# Patient Record
Sex: Female | Born: 1940 | Race: Black or African American | Hispanic: No | Smoking: Never smoker
Health system: Southern US, Community
[De-identification: ages and names within clinical notes are randomized; demographics above are authoritative.]

## PROBLEM LIST (undated history)

## (undated) DIAGNOSIS — I10 Essential (primary) hypertension: Secondary | ICD-10-CM

## (undated) DIAGNOSIS — E119 Type 2 diabetes mellitus without complications: Secondary | ICD-10-CM

## (undated) DIAGNOSIS — K219 Gastro-esophageal reflux disease without esophagitis: Secondary | ICD-10-CM

## (undated) DIAGNOSIS — M545 Low back pain, unspecified: Secondary | ICD-10-CM

## (undated) DIAGNOSIS — G8929 Other chronic pain: Secondary | ICD-10-CM

## (undated) DIAGNOSIS — E7800 Pure hypercholesterolemia, unspecified: Secondary | ICD-10-CM

## (undated) DIAGNOSIS — D699 Hemorrhagic condition, unspecified: Secondary | ICD-10-CM

## (undated) DIAGNOSIS — I2510 Atherosclerotic heart disease of native coronary artery without angina pectoris: Secondary | ICD-10-CM

## (undated) DIAGNOSIS — R011 Cardiac murmur, unspecified: Secondary | ICD-10-CM

## (undated) DIAGNOSIS — R51 Headache: Secondary | ICD-10-CM

## (undated) DIAGNOSIS — N289 Disorder of kidney and ureter, unspecified: Secondary | ICD-10-CM

## (undated) DIAGNOSIS — Z8711 Personal history of peptic ulcer disease: Secondary | ICD-10-CM

## (undated) DIAGNOSIS — R519 Headache, unspecified: Secondary | ICD-10-CM

## (undated) DIAGNOSIS — Z8719 Personal history of other diseases of the digestive system: Secondary | ICD-10-CM

## (undated) HISTORY — PX: DILATION AND CURETTAGE OF UTERUS: SHX78

## (undated) HISTORY — PX: RIGHT OOPHORECTOMY: SHX2359

## (undated) HISTORY — PX: GLAUCOMA SURGERY: SHX656

## (undated) HISTORY — PX: CATARACT EXTRACTION W/ INTRAOCULAR LENS IMPLANT: SHX1309

## (undated) HISTORY — PX: LEFT OOPHORECTOMY: SHX1961

## (undated) HISTORY — PX: TUBAL LIGATION: SHX77

## (undated) HISTORY — PX: ABDOMINAL HYSTERECTOMY: SHX81

---

## 2012-05-18 ENCOUNTER — Encounter (HOSPITAL_BASED_OUTPATIENT_CLINIC_OR_DEPARTMENT_OTHER): Payer: Self-pay | Admitting: Family Medicine

## 2012-05-18 ENCOUNTER — Emergency Department (HOSPITAL_BASED_OUTPATIENT_CLINIC_OR_DEPARTMENT_OTHER)
Admission: EM | Admit: 2012-05-18 | Discharge: 2012-05-18 | Disposition: A | Discharge status: Home / Self Care | Payer: Self-pay | Attending: Emergency Medicine | Admitting: Emergency Medicine

## 2012-05-18 DIAGNOSIS — H109 Unspecified conjunctivitis: Secondary | ICD-10-CM | POA: Insufficient documentation

## 2012-05-18 DIAGNOSIS — I10 Essential (primary) hypertension: Secondary | ICD-10-CM | POA: Insufficient documentation

## 2012-05-18 DIAGNOSIS — Z79899 Other long term (current) drug therapy: Secondary | ICD-10-CM | POA: Insufficient documentation

## 2012-05-18 DIAGNOSIS — E119 Type 2 diabetes mellitus without complications: Secondary | ICD-10-CM | POA: Insufficient documentation

## 2012-05-18 DIAGNOSIS — H5789 Other specified disorders of eye and adnexa: Secondary | ICD-10-CM | POA: Insufficient documentation

## 2012-05-18 DIAGNOSIS — Z90710 Acquired absence of both cervix and uterus: Secondary | ICD-10-CM | POA: Insufficient documentation

## 2012-05-18 DIAGNOSIS — K219 Gastro-esophageal reflux disease without esophagitis: Secondary | ICD-10-CM | POA: Insufficient documentation

## 2012-05-18 HISTORY — DX: Essential (primary) hypertension: I10

## 2012-05-18 HISTORY — DX: Gastro-esophageal reflux disease without esophagitis: K21.9

## 2012-05-18 MED ORDER — NAPHAZOLINE HCL 0.1 % OP SOLN: 1.0000 [drp] | Freq: Four times a day (QID) | OPHTHALMIC | Status: AC | PRN

## 2012-05-18 NOTE — ED Provider Notes (Signed)
History     CSN: [id number]  Arrival date & time 05/18/12  1034   First MD Initiated Contact with Patient 05/18/12 1102      Chief Complaint  Patient presents with  . Eye Problem     HPI Pt c/o itching, burning and drainage to left eye x 3 days. Pt sts vision is normal.   Past Medical History  Diagnosis Date  . Hypertension   . Diabetes mellitus without complication   . GERD (gastroesophageal reflux disease)     Past Surgical History  Procedure Date  . Cataract extraction   . Abdominal hysterectomy   . Dilation and curettage of uterus     No family history on file.  History  Substance Use Topics  . Smoking status: Never Smoker   . Smokeless tobacco: Former Neurosurgeon    Types: Snuff  . Alcohol Use: No     alcoholic    OB History    Grav Para Term Preterm Abortions TAB SAB Ect Mult Living                  Review of Systems All other systems reviewed and are negative Allergies  Amoxicillin and Aspirin  Home Medications   Current Outpatient Rx  Name Route Sig Dispense Refill  . AMLODIPINE BESYLATE PO Oral Take by mouth.    . METFORMIN HCL 500 MG PO TABS Oral Take 500 mg by mouth daily with breakfast.    . PANTOPRAZOLE SODIUM 20 MG PO TBEC Oral Take 20 mg by mouth daily.    Marland Kitchen POTASSIUM CHLORIDE ER 10 MEQ PO TBCR Oral Take 10 mEq by mouth 2 (two) times daily.    Marland Kitchen NAPHAZOLINE HCL 0.1 % OP SOLN Left Eye Place 1 drop into the left eye 4 (four) times daily as needed. 15 mL 0    BP 156/102  Pulse 74  Temp 98.3 F (36.8 C) (Oral)  Resp 18  Ht 5\' 3"  (1.6 m)  Wt 165 lb (74.844 kg)  BMI 29.23 kg/m2  SpO2 100%  Physical Exam  Nursing note and vitals reviewed. Constitutional: She is oriented to person, place, and time. She appears well-developed and well-nourished. No distress.  HENT:  Head: Normocephalic and atraumatic.  Eyes: Conjunctivae normal and EOM are normal. Pupils are equal, round, and reactive to light. No foreign bodies found. Right eye exhibits  no discharge, no exudate and no hordeolum. Left eye exhibits no discharge, no exudate and no hordeolum. No foreign body present in the left eye. No scleral icterus.  Neck: Normal range of motion.  Cardiovascular: Normal rate and intact distal pulses.   Pulmonary/Chest: No respiratory distress.  Abdominal: Normal appearance. She exhibits no distension.  Musculoskeletal: Normal range of motion.  Neurological: She is alert and oriented to person, place, and time. No cranial nerve deficit.  Skin: Skin is warm and dry. No rash noted.  Psychiatric: She has a normal mood and affect. Her behavior is normal.    ED Course  Procedures (including critical care time)  Labs Reviewed - No data to display No results found.   1. Conjunctivitis       MDM         Nelia Shi, MD 05/18/12 [phone]

## 2012-05-18 NOTE — ED Notes (Signed)
Pt c/o itching, burning and drainage to left eye x 3 days. Pt sts vision is normal.

## 2014-04-01 ENCOUNTER — Emergency Department (HOSPITAL_BASED_OUTPATIENT_CLINIC_OR_DEPARTMENT_OTHER)
Admission: EM | Admit: 2014-04-01 | Discharge: 2014-04-01 | Disposition: A | Discharge status: Home / Self Care | Payer: Medicare Other | Attending: Emergency Medicine | Admitting: Emergency Medicine

## 2014-04-01 ENCOUNTER — Emergency Department (HOSPITAL_BASED_OUTPATIENT_CLINIC_OR_DEPARTMENT_OTHER)
Admit: 2014-04-01 | Discharge: 2014-04-01 | Disposition: A | Discharge status: Home / Self Care | Payer: Medicare Other | Attending: Emergency Medicine | Admitting: Emergency Medicine

## 2014-04-01 ENCOUNTER — Encounter (HOSPITAL_BASED_OUTPATIENT_CLINIC_OR_DEPARTMENT_OTHER): Payer: Self-pay | Admitting: Emergency Medicine

## 2014-04-01 DIAGNOSIS — R519 Headache, unspecified: Secondary | ICD-10-CM

## 2014-04-01 DIAGNOSIS — E7800 Pure hypercholesterolemia, unspecified: Secondary | ICD-10-CM | POA: Insufficient documentation

## 2014-04-01 DIAGNOSIS — R059 Cough, unspecified: Secondary | ICD-10-CM | POA: Diagnosis not present

## 2014-04-01 DIAGNOSIS — E119 Type 2 diabetes mellitus without complications: Secondary | ICD-10-CM | POA: Diagnosis not present

## 2014-04-01 DIAGNOSIS — H539 Unspecified visual disturbance: Secondary | ICD-10-CM | POA: Insufficient documentation

## 2014-04-01 DIAGNOSIS — R51 Headache: Secondary | ICD-10-CM | POA: Insufficient documentation

## 2014-04-01 DIAGNOSIS — R05 Cough: Secondary | ICD-10-CM | POA: Diagnosis not present

## 2014-04-01 DIAGNOSIS — Z880 Allergy status to penicillin: Secondary | ICD-10-CM | POA: Insufficient documentation

## 2014-04-01 DIAGNOSIS — Z90710 Acquired absence of both cervix and uterus: Secondary | ICD-10-CM | POA: Insufficient documentation

## 2014-04-01 DIAGNOSIS — R1012 Left upper quadrant pain: Secondary | ICD-10-CM | POA: Diagnosis not present

## 2014-04-01 DIAGNOSIS — Z79899 Other long term (current) drug therapy: Secondary | ICD-10-CM | POA: Diagnosis not present

## 2014-04-01 DIAGNOSIS — K219 Gastro-esophageal reflux disease without esophagitis: Secondary | ICD-10-CM | POA: Diagnosis not present

## 2014-04-01 DIAGNOSIS — I10 Essential (primary) hypertension: Secondary | ICD-10-CM | POA: Insufficient documentation

## 2014-04-01 HISTORY — DX: Pure hypercholesterolemia, unspecified: E78.00

## 2014-04-01 LAB — CBC WITH DIFFERENTIAL/PLATELET
Basophils Absolute: 0 10*3/uL (ref 0.0–0.1)
Basophils Relative: 0 % (ref 0–1)
EOS PCT: 1 % (ref 0–5)
Eosinophils Absolute: 0.1 10*3/uL (ref 0.0–0.7)
HCT: 37.5 % (ref 36.0–46.0)
HEMOGLOBIN: 12.7 g/dL (ref 12.0–15.0)
LYMPHS ABS: 2.3 10*3/uL (ref 0.7–4.0)
LYMPHS PCT: 35 % (ref 12–46)
MCH: 27.9 pg (ref 26.0–34.0)
MCHC: 33.9 g/dL (ref 30.0–36.0)
MCV: 82.2 fL (ref 78.0–100.0)
MONOS PCT: 6 % (ref 3–12)
Monocytes Absolute: 0.4 10*3/uL (ref 0.1–1.0)
NEUTROS PCT: 58 % (ref 43–77)
Neutro Abs: 3.9 10*3/uL (ref 1.7–7.7)
PLATELETS: 256 10*3/uL (ref 150–400)
RBC: 4.56 MIL/uL (ref 3.87–5.11)
RDW: 14.6 % (ref 11.5–15.5)
WBC: 6.7 10*3/uL (ref 4.0–10.5)

## 2014-04-01 LAB — URINALYSIS, ROUTINE W REFLEX MICROSCOPIC
BILIRUBIN URINE: NEGATIVE
GLUCOSE, UA: NEGATIVE mg/dL
HGB URINE DIPSTICK: NEGATIVE
KETONES UR: NEGATIVE mg/dL
Leukocytes, UA: NEGATIVE
Nitrite: NEGATIVE
PROTEIN: NEGATIVE mg/dL
Specific Gravity, Urine: 1.003 — ABNORMAL LOW (ref 1.005–1.030)
UROBILINOGEN UA: 0.2 mg/dL (ref 0.0–1.0)
pH: 6.5 (ref 5.0–8.0)

## 2014-04-01 LAB — COMPREHENSIVE METABOLIC PANEL
ALK PHOS: 103 U/L (ref 39–117)
ALT: 12 U/L (ref 0–35)
ANION GAP: 14 (ref 5–15)
AST: 15 U/L (ref 0–37)
Albumin: 4.1 g/dL (ref 3.5–5.2)
BUN: 19 mg/dL (ref 6–23)
CO2: 26 meq/L (ref 19–32)
Calcium: 10 mg/dL (ref 8.4–10.5)
Chloride: 102 mEq/L (ref 96–112)
Creatinine, Ser: 1.2 mg/dL — ABNORMAL HIGH (ref 0.50–1.10)
GFR, EST AFRICAN AMERICAN: 52 mL/min — AB (ref >90)
GFR, EST NON AFRICAN AMERICAN: 45 mL/min — AB (ref >90)
GLUCOSE: 99 mg/dL (ref 70–99)
POTASSIUM: 3.6 meq/L — AB (ref 3.7–5.3)
SODIUM: 142 meq/L (ref 137–147)
Total Bilirubin: 0.5 mg/dL (ref 0.3–1.2)
Total Protein: 8 g/dL (ref 6.0–8.3)

## 2014-04-01 LAB — LIPASE, BLOOD: Lipase: 20 U/L (ref 11–59)

## 2014-04-01 MED ORDER — ESOMEPRAZOLE MAGNESIUM 40 MG PO CPDR: 40.0000 mg | DELAYED_RELEASE_CAPSULE | Freq: Every day | ORAL | Status: DC

## 2014-04-01 MED ORDER — METRONIDAZOLE 500 MG PO TABS: 500.0000 mg | ORAL_TABLET | Freq: Two times a day (BID) | ORAL | Status: DC

## 2014-04-01 MED ORDER — SODIUM CHLORIDE 0.9 % IV BOLUS (SEPSIS)
1000.0000 mL | Freq: Once | INTRAVENOUS | Status: AC
  Administered 2014-04-01: 1000 mL via INTRAVENOUS

## 2014-04-01 MED ORDER — GI COCKTAIL ~~LOC~~
30.0000 mL | Freq: Once | ORAL | Status: AC
  Administered 2014-04-01: 30 mL via ORAL
  Filled 2014-04-01: qty 30

## 2014-04-01 MED ORDER — CLARITHROMYCIN 500 MG PO TABS: 500.0000 mg | ORAL_TABLET | Freq: Two times a day (BID) | ORAL | Status: DC

## 2014-04-01 NOTE — ED Notes (Signed)
Periumbilical pain and Headaches x3 says. Denies vomiting or diarrhea.

## 2014-04-01 NOTE — ED Provider Notes (Signed)
CSN: [id number]     Arrival date & time 04/01/14  1736 History  This chart was scribed for Debby Freiberg, MD by Peyton Bottoms, ED Scribe. This patient was seen in room MH11/MH11 and the patient's care was started at 5:57 PM.   Chief Complaint  Patient presents with  . Abdominal Pain   Patient is a 73 y.o. female presenting with abdominal pain and headaches. The history is provided by the patient. No language interpreter was used.  Abdominal Pain Pain location:  LUQ Pain quality: aching and dull   Pain radiates to:  Does not radiate Pain severity:  Moderate Onset quality:  Gradual Duration:  2 weeks Timing:  Sporadic Progression:  Unchanged Chronicity:  Recurrent Context: previous surgery   Relieved by:  Nothing Worsened by:  Nothing tried Ineffective treatments:  None tried Associated symptoms: cough   Associated symptoms: no chest pain, no dysuria, no fever, no nausea, no shortness of breath, no sore throat, no vaginal bleeding, no vaginal discharge and no vomiting   Headache Pain location:  Generalized Quality:  Dull Radiates to:  Does not radiate Onset quality:  Gradual Timing:  Intermittent Similar to prior headaches: no   Associated symptoms: abdominal pain and cough   Associated symptoms: no dizziness, no fever, no nausea, no sore throat and no vomiting     HPI Comments: Caroline Fernandez is a 73 y.o. female with a prior history of hypertension, GERD, high cholesterol, diabetes, and abdominal hysterectomy, who presents to the Emergency Department complaining of moderate, gradually worsening left abdominal pain that began 2 weeks ago. Patient states that it is a "nagging pain that stays for a while and then goes away." She states that she has a history of similar symptoms in the past and the doctor at that time told her that her "abdomen was bleeding". Patient also complains of a headache that began 3 days ago as well. She states that she feels intermittent pain in her  temporal area. Patient also states that "she has been leaning on one side when walking" since onset of abdominal pain and headache. Patient states she noted blurred vision, but denies associated dizziness. Patient also complains of soreness bilaterally in her legs.  Past Medical History  Diagnosis Date  . Hypertension   . Diabetes mellitus without complication   . GERD (gastroesophageal reflux disease)   . High cholesterol   . Cataract    Past Surgical History  Procedure Laterality Date  . Cataract extraction    . Abdominal hysterectomy    . Dilation and curettage of uterus     No family history on file. History  Substance Use Topics  . Smoking status: Never Smoker   . Smokeless tobacco: Former Systems developer    Types: Snuff  . Alcohol Use: No     Comment: alcoholic   OB History   Grav Para Term Preterm Abortions TAB SAB Ect Mult Living                 Review of Systems  Constitutional: Negative for fever.  HENT: Negative for sore throat.   Eyes: Positive for visual disturbance.  Respiratory: Positive for cough. Negative for shortness of breath.   Cardiovascular: Negative for chest pain.  Gastrointestinal: Positive for abdominal pain. Negative for nausea and vomiting.  Genitourinary: Negative for dysuria, vaginal bleeding and vaginal discharge.  Neurological: Positive for headaches. Negative for dizziness.  All other systems reviewed and are negative.  Allergies  Amoxicillin and Aspirin  Home Medications  Prior to Admission medications   Medication Sig Start Date End Date Taking? Authorizing Provider  atorvastatin (LIPITOR) 20 MG tablet Take 20 mg by mouth daily.   Yes Historical Provider, MD  losartan (COZAAR) 50 MG tablet Take 50 mg by mouth daily.   Yes Historical Provider, MD  metFORMIN (GLUCOPHAGE) 500 MG tablet Take 500 mg by mouth daily with breakfast.   Yes Historical Provider, MD  pantoprazole (PROTONIX) 20 MG tablet Take 40 mg by mouth daily.    Yes Historical  Provider, MD  potassium chloride (K-DUR) 10 MEQ tablet Take 10 mEq by mouth 2 (two) times daily.   Yes Historical Provider, MD  AMLODIPINE BESYLATE PO Take by mouth.    Historical Provider, MD  clarithromycin (BIAXIN) 500 MG tablet Take 1 tablet (500 mg total) by mouth 2 (two) times daily. 04/01/14   Debby Freiberg, MD  esomeprazole (NEXIUM) 40 MG capsule Take 1 capsule (40 mg total) by mouth daily. 04/01/14   Debby Freiberg, MD  metroNIDAZOLE (FLAGYL) 500 MG tablet Take 1 tablet (500 mg total) by mouth 2 (two) times daily. 04/01/14   Debby Freiberg, MD  naphazoline (NAPHCON) 0.1 % ophthalmic solution Place 1 drop into the left eye 4 (four) times daily as needed. 05/18/12   Dot Lanes, MD   Triage Vitals: BP 192/88  Pulse 81  Temp(Src) 98.9 F (37.2 C) (Oral)  Resp 16  Ht 5\' 4"  (1.626 m)  Wt 160 lb (72.576 kg)  BMI 27.45 kg/m2  SpO2 99%  Physical Exam  Nursing note and vitals reviewed. Constitutional: She appears well-developed and well-nourished.  HENT:  Head: Normocephalic and atraumatic.  Eyes: Conjunctivae are normal. Right eye exhibits no discharge. Left eye exhibits no discharge.  Cardiovascular: Normal rate, regular rhythm and normal heart sounds.   Pulmonary/Chest: Effort normal and breath sounds normal. No respiratory distress.  Abdominal: There is tenderness (LUQ).  Neurological: She is alert. Coordination and gait normal.  Reflex Scores:      Patellar reflexes are 2+ on the right side and 2+ on the left side.      Achilles reflexes are 2+ on the right side and 2+ on the left side. Skin: Skin is warm and dry. No rash noted. She is not diaphoretic. No erythema.  Psychiatric: She has a normal mood and affect.   ED Course  Procedures (including critical care time)  DIAGNOSTIC STUDIES: Oxygen Saturation is 99% on RA, normal by my interpretation.    COORDINATION OF CARE: 11:49 PM- Pt advised of plan for treatment and pt agrees.  Labs Review Labs Reviewed   COMPREHENSIVE METABOLIC PANEL - Abnormal; Notable for the following:    Potassium 3.6 (*)    Creatinine, Ser 1.20 (*)    GFR calc non Af Amer 45 (*)    GFR calc Af Amer 52 (*)    All other components within normal limits  URINALYSIS, ROUTINE W REFLEX MICROSCOPIC - Abnormal; Notable for the following:    Specific Gravity, Urine 1.003 (*)    All other components within normal limits  CBC WITH DIFFERENTIAL  LIPASE, BLOOD  H. PYLORI ANTIBODY, IGG    Imaging Review Dg Chest 2 View  04/01/2014   CLINICAL DATA:  Abdominal pain.  Left chest discomfort.  EXAM: CHEST  2 VIEW  COMPARISON:  Chest radiograph 10/25/2013  FINDINGS:  Normal heart, mediastinal, and hilar contours. Stable severe convex right scoliosis of the thoracic spine. Lungs are normally expanded and clear. Negative for pleural effusion or  pneumothorax.  IMPRESSION: No active cardiopulmonary disease.  Chronic severe scoliosis.   Electronically Signed   By: Curlene Dolphin M.D.   On: 04/01/2014 18:53     EKG Interpretation None      MDM   Final diagnoses:  Left upper quadrant pain  Acute nonintractable headache, unspecified headache type    73 y.o. female  with pertinent PMH of GERD, HTN, DM presents with recurrent abdominal pain and headache x2-3 days. Patient has a chronic history of the same and requires repeated prompting to establish the true chronicity of her problems, initially she stated that she only had symptoms for 2 days, followed by one week, followed by one month.  On further discussion her symptoms began during the acute illness and death of her husband.  She states that she's been admitted in the past for "bleeding in her stomach ". She was not seen by a surgeon for this problem.     Obtained labs, EKG for generalized symptoms, cxr for same.  HA gradual onset, no concering historical features.  No indication for emergent imaging with gradual onset ha not present during my examination.  Pt asymptomatic throughout my  care.  No LLQ tenderness to indicate diverticular etiology, and with ho GERD, suspect likely PUD pathology.  Pt stable to dc home with pcp fu.  DC home in stable condition with triple therapy.  1. Left upper quadrant pain   2. Acute nonintractable headache, unspecified headache type       I personally performed the services described in this documentation, which was scribed in my presence. The recorded information has been reviewed and is accurate.    Debby Freiberg, MD 04/01/14 [phone]

## 2014-04-01 NOTE — ED Notes (Signed)
Pt ambulatory to restroom no difficulty.  

## 2014-04-01 NOTE — Discharge Instructions (Signed)
Abdominal Pain °Many things can cause abdominal pain. Usually, abdominal pain is not caused by a disease and will improve without treatment. It can often be observed and treated at home. Your health care provider will do a physical exam and possibly order blood tests and X-rays to help determine the seriousness of your pain. However, in many cases, more time must pass before a clear cause of the pain can be found. Before that point, your health care provider may not know if you need more testing or further treatment. °HOME CARE INSTRUCTIONS  °Monitor your abdominal pain for any changes. The following actions may help to alleviate any discomfort you are experiencing: °· Only take over-the-counter or prescription medicines as directed by your health care provider. °· Do not take laxatives unless directed to do so by your health care provider. °· Try a clear liquid diet (broth, tea, or water) as directed by your health care provider. Slowly move to a bland diet as tolerated. °SEEK MEDICAL CARE IF: °· You have unexplained abdominal pain. °· You have abdominal pain associated with nausea or diarrhea. °· You have pain when you urinate or have a bowel movement. °· You experience abdominal pain that wakes you in the night. °· You have abdominal pain that is worsened or improved by eating food. °· You have abdominal pain that is worsened with eating fatty foods. °· You have a fever. °SEEK IMMEDIATE MEDICAL CARE IF:  °· Your pain does not go away within 2 hours. °· You keep throwing up (vomiting). °· Your pain is felt only in portions of the abdomen, such as the right side or the left lower portion of the abdomen. °· You pass bloody or black tarry stools. °MAKE SURE YOU: °· Understand these instructions.   °· Will watch your condition.   °· Will get help right away if you are not doing well or get worse.   °Document Released: 04/20/2005 Document Revised: 07/16/2013 Document Reviewed: 03/20/2013 °ExitCare® Patient Information  ©2015 [employer]. This information is not intended to replace advice given to you by your health care provider. Make sure you discuss any questions you have with your health care provider. ° ° °Emergency Department Resource Guide °1) Find a Doctor and Pay Out of Pocket °Although you won't have to find out who is covered by your insurance plan, it is a good idea to ask around and get recommendations. You will then need to call the office and see if the doctor you have chosen will accept you as a new patient and what types of options they offer for patients who are self-pay. Some doctors offer discounts or will set up payment plans for their patients who do not have insurance, but you will need to ask so you aren't surprised when you get to your appointment. ° °2) Contact Your Local Health Department °Not all health departments have doctors that can see patients for sick visits, but many do, so it is worth a call to see if yours does. If you don't know where your local health department is, you can check in your phone book. The CDC also has a tool to help you locate your state's health department, and many state websites also have listings of all of their local health departments. ° °3) Find a Walk-in Clinic °If your illness is not likely to be very severe or complicated, you may want to try a walk in clinic. These are popping up all over the country in pharmacies, drugstores, and shopping centers. They're   usually staffed by nurse practitioners or physician assistants that have been trained to treat common illnesses and complaints. They're usually fairly quick and inexpensive. However, if you have serious medical issues or chronic medical problems, these are probably not your best option. ° °No Primary Care Doctor: °- Call [facility] at  832-8000 - they can help you locate a primary care doctor that  accepts your insurance, provides certain services, etc. °- Physician Referral Service- 1-800-533-3463 ° °Chronic  Pain Problems: °Organization         Address  Phone   Notes  °[facility]  (336) 297-2271 Patients need to be referred by their primary care doctor.  ° °Medication Assistance: °Organization         Address  Phone   Notes  °[place] [facility] [address] °[place], [place] [address] (336) 641-8030 --Must be a resident of [place] °-- Must have NO insurance coverage whatsoever (no Medicaid/ Medicare, etc.) °-- The pt. MUST have a primary care doctor that directs their care regularly and follows them in the community °  °[facility]  (866) 331-1348   °[employer]  (888) 892-1162   ° °Agencies that provide inexpensive medical care: °Organization         Address  Phone   Notes  °[facility]  (336) 832-8035   °[facility]    (336) 832-7272   °[facility] [address] °[place], [place] [address] (336) 832-4777   °[facility] [address], °[place] (336) 271-4999   °[facility]    (336) 373-0678   °[facility]    (336) 272-1050   °[facility] ° [address], [place] Phone:  (336) 832-4444, Fax:  (336) 832-4440 Hours of Operation:  9 am - 6 pm, M-F.  Also accepts Medicaid/Medicare and self-pay.  °[facility] ° [address], [place] Phone: (336) 832-3150, Fax: (336) 832-3151. Hours of Operation:  8:30 am - 5:30 pm, M-F.  Also accepts Medicaid and self-pay.  °[facility] [place] [address], [place] Phone: (336) 878-6027   °[facility] [address], [place], [place] (336)723-1848, Ext. 123 Mondays & Thursdays: 7-9 AM.  First 15 patients are seen on a first come, first serve basis. °  ° °Medicaid-accepting [place] Providers: ° °Organization         Address  Phone   Notes  °[facility] [address], [place] (336) 641-2100 Also  accepts self-pay patients.  °[facility] [address], [place] ° (336) 856-9996   °[facility] [address], [place] (336) 288-8857   °[facility] [address] [place] [address], [place] (336) 299-7000   °Veita Bland [address], [place]  ° (336) 373-1557 Only accepts [place] Access Medicaid patients after they have their name applied to their card.  ° °Self-Pay (no insurance) in [place]: ° °Organization         Address  Phone   Notes  °Sickle Cell Patients, [facility] [address], [place] (336) 832-1970   °[facility] [address], [place] (336) 832-4400   °[facility] ° [address], [place] (336) 992-4800   °[facility]/Dr. Osei-Bonsu ° [address] [place] [address], [place] or [address], [place] (336) 841-8500 Phone number for both [place] and [place] locations is the same.  °[facility] [address], [place] (336) 299-0000   °[facility] [address] [place] [address], [place] or [address] (336) 852-7530 °(336) 878-2260   °[facility] [address], [place] (336) 350-1642, phone; (336) 294-5005, fax Sees patients 1st and 3rd Saturday of every month.  Must not qualify for public or private insurance (i.e. Medicaid, Medicare, [place] Health Choice, Veterans' Benefits) • Household income should be no more than 200% of the poverty level •The clinic cannot treat you if you are pregnant or think you are pregnant • Sexually transmitted diseases are not treated at the clinic.  ° ° °Dental Care: °Organization         Address  Phone  Notes  °[facility] [address], [place] (336) 641-6152 Accepts children up to age 21 who are enrolled in Medicaid or [place] Health Choice; pregnant  women with a Medicaid card; and children who have applied for Medicaid or [place] Health Choice, but were declined, whose parents can pay a reduced fee at time of service.  °[facility] [place]  [address], [place] (336) 641-7733 Accepts children up to age 21 who are enrolled in Medicaid or [place] Health Choice; pregnant women with a Medicaid card; and children who have applied for Medicaid or [place] Health Choice, but were declined, whose parents can pay a reduced fee at time of service.  °[facility] ° [address], [place] (336) 641-4533 Patients are seen by appointment only. Walk-ins are not accepted. [facility] will see patients 18 years of age and older. °Monday - Tuesday (8am-5pm) °Most Wednesdays (8:30-5pm) °$30 per visit, cash only  °[facility] ° [address], [place] (336) 641-4533 Patients are seen by appointment only. Walk-ins are not accepted. [facility] will see patients 18 years of age and older. °One Wednesday Evening (Monthly: Volunteer Based).  $30 per visit, cash only  °[facility]  (919) 537-3737 for adults; Children under age 4, call Graduate Pediatric Dentistry at (919) [phone]. Children aged 4-14, please call (919) [phone] to request a pediatric application. ° Dental services are provided in all areas of dental care including fillings, crowns and bridges, complete and partial dentures, implants, gum treatment, root canals, and extractions. Preventive care is also provided. Treatment is provided to both adults and children. °Patients are selected via a lottery and there is often a waiting list. °  °[facility] [address], ° ° (336) 763-8833 [email or web address].[email or web address] °  °[facility] [address], [place], [place] (336)723-1848, Ext. 123 Second and Fourth Thursday of each month, opens at 6:30 AM; Clinic ends at 9 AM.  Patients are  seen on a first-come first-served basis, and a limited number are seen during each clinic.  ° °[facility] ° [address], [place], [place] (336) 723-7904   Eligibility Requirements °You must have lived in [place], [place], or [place] counties for at least the last three months. °  You cannot be eligible for state or federal sponsored healthcare insurance, including Veterans Administration, Medicaid, or Medicare. °  You generally cannot be eligible for healthcare insurance through   your employer.  °  How to apply: °Eligibility screenings are held every Tuesday and Wednesday afternoon from 1:00 pm until 4:00 pm. You do not need an appointment for the interview!  °[facility] [address], [place], [place] 336-631-2330   °[facility]  336-342-8273   °[facility]  336-703-3100   °[facility]  336-570-6415   ° °Behavioral Health Resources in the Community: °Intensive Outpatient Programs °Organization         Address  Phone  Notes  °[facility] [address], [place], [place] 336-878-6098   °[facility] [address], [place], [place] 336-832-9800   °[facility] [address], [place], [place] ° 336-882-2125   °[facility] [address],  °[place], [place] 1-800-853-5163 or 336-641-4981   °Substance Abuse Resources °Organization         Address  Phone  Notes  °[facility]  336-882-2125   °[facility]  336-784-9470   °[facility]  336-285-9073   °[facility]  336-845-3988   °Residential & Outpatient Substance Abuse Program  1-800-659-3381   °Psychological Services °Organization         Address  Phone  Notes  °[facility]  336- 832-9600   °[facility]  336- 378-7881   °[facility] [address], [place] 1-800-853-5163 or 336-641-4981   ° °Mobile Crisis  Teams °Organization         Address  Phone  Notes  °[facility], Mobile Crisis Care Unit  1-877-626-1772   °Assertive °[facility] ° [address] [place], [place] 336-834-9664   °Sharon DeEsch [address] °[place] 336-554-5454   ° °Self-Help/Support Groups °Organization         Address  Phone             Notes  °[facility] - variety of support groups  336- 373-1402 Call for more information  °Narcotics Anonymous (NA), [facility] [address], °[place]  2 meetings at this location  ° °Residential Treatment Programs °Organization         Address  Phone  Notes  °[facility] [address],    °[place]  1-866-801-8205   °[facility] ° [address], [place], [place] 704-293-8524   °[facility] [address], [place] 336-845-3988 Admissions: 8am-3pm M-F  °[facility] [address]    °[place], [place] 336-841-1104   °[facility] [address], [place], [place] 336-379-7146   °[facility] [address]  °[place], [place] 336-285-9073   °[facility] - Intensive Outpatient [address], , [place] 336-852-3033   °[facility] [address]  °[place], [place] 1-877-615-2722 or 336-784-9470   °[facility] [address] [place], [place] 336-227-7417 Accepts Medicaid  °[facility] [address]  ° [place] 1-800-659-3381 Substance Abuse/Addiction Treatment  ° °[place] Behavioral Health Resources °Organization         Address  Phone  Notes  °[facility]  (888) 581-9988   °Julie Brannon, PhD [address] [place],    (336) 349-5553 or (336) 951-0000   °[facility]   [address] °[place],  (336) 349-4454   °  [facility] [address], [place], [place] (336) 342-8316  Insurance/Medicaid/sponsorship through [facility]  °[facility] [address]                                    [place], [place] (336) 342-8316 Therapy/tele-psych/case  °[facility] [address]  ° [place], [place] (336) 349-2233    °Dr. Arfeen  (336) 349-4544   °[facility] [place]  [employer] [place] [facility] 1) [address], [place] °2) [address], [place] °3)  [address], [place] (336) 349-3220 °(336) 342-7768 ° °(336) 342-8140   °[place] [facility] (336) 342-1394 or (336) 342-3537 (After Hours)    ° ° ° ° °

## 2014-04-02 ENCOUNTER — Other Ambulatory Visit (HOSPITAL_BASED_OUTPATIENT_CLINIC_OR_DEPARTMENT_OTHER): Payer: Medicare Other

## 2014-04-02 LAB — H. PYLORI ANTIBODY, IGG: H Pylori IgG: 0.86 {ISR}

## 2014-08-24 ENCOUNTER — Observation Stay (HOSPITAL_BASED_OUTPATIENT_CLINIC_OR_DEPARTMENT_OTHER)
Admission: EM | Admit: 2014-08-24 | Discharge: 2014-08-25 | Disposition: A | Discharge status: Home / Self Care | Payer: Medicare Other | Attending: Internal Medicine | Admitting: Internal Medicine

## 2014-08-24 ENCOUNTER — Emergency Department (HOSPITAL_BASED_OUTPATIENT_CLINIC_OR_DEPARTMENT_OTHER): Payer: Medicare Other

## 2014-08-24 ENCOUNTER — Encounter (HOSPITAL_BASED_OUTPATIENT_CLINIC_OR_DEPARTMENT_OTHER): Payer: Self-pay | Admitting: *Deleted

## 2014-08-24 DIAGNOSIS — E785 Hyperlipidemia, unspecified: Secondary | ICD-10-CM | POA: Diagnosis present

## 2014-08-24 DIAGNOSIS — E876 Hypokalemia: Secondary | ICD-10-CM | POA: Diagnosis not present

## 2014-08-24 DIAGNOSIS — K219 Gastro-esophageal reflux disease without esophagitis: Secondary | ICD-10-CM | POA: Diagnosis not present

## 2014-08-24 DIAGNOSIS — E119 Type 2 diabetes mellitus without complications: Secondary | ICD-10-CM | POA: Diagnosis not present

## 2014-08-24 DIAGNOSIS — E780 Pure hypercholesterolemia: Secondary | ICD-10-CM | POA: Insufficient documentation

## 2014-08-24 DIAGNOSIS — I10 Essential (primary) hypertension: Secondary | ICD-10-CM | POA: Diagnosis not present

## 2014-08-24 DIAGNOSIS — R1013 Epigastric pain: Secondary | ICD-10-CM | POA: Diagnosis not present

## 2014-08-24 DIAGNOSIS — R197 Diarrhea, unspecified: Secondary | ICD-10-CM | POA: Insufficient documentation

## 2014-08-24 DIAGNOSIS — E860 Dehydration: Secondary | ICD-10-CM

## 2014-08-24 DIAGNOSIS — R55 Syncope and collapse: Principal | ICD-10-CM | POA: Diagnosis present

## 2014-08-24 DIAGNOSIS — E871 Hypo-osmolality and hyponatremia: Secondary | ICD-10-CM | POA: Diagnosis not present

## 2014-08-24 DIAGNOSIS — R52 Pain, unspecified: Secondary | ICD-10-CM

## 2014-08-24 LAB — COMPREHENSIVE METABOLIC PANEL
ALK PHOS: 88 U/L (ref 39–117)
ALT: 16 U/L (ref 0–35)
AST: 24 U/L (ref 0–37)
Albumin: 4.6 g/dL (ref 3.5–5.2)
Anion gap: 8 (ref 5–15)
BILIRUBIN TOTAL: 1.2 mg/dL (ref 0.3–1.2)
BUN: 12 mg/dL (ref 6–23)
CALCIUM: 9.6 mg/dL (ref 8.4–10.5)
CHLORIDE: 89 mmol/L — AB (ref 96–112)
CO2: 30 mmol/L (ref 19–32)
CREATININE: 1.06 mg/dL (ref 0.50–1.10)
GFR calc Af Amer: 60 mL/min — ABNORMAL LOW (ref >90)
GFR, EST NON AFRICAN AMERICAN: 52 mL/min — AB (ref >90)
Glucose, Bld: 133 mg/dL — ABNORMAL HIGH (ref 70–99)
POTASSIUM: 3 mmol/L — AB (ref 3.5–5.1)
Sodium: 127 mmol/L — ABNORMAL LOW (ref 135–145)
Total Protein: 8.3 g/dL (ref 6.0–8.3)

## 2014-08-24 LAB — CBC
HCT: 34.7 % — ABNORMAL LOW (ref 36.0–46.0)
HEMATOCRIT: 37.1 % (ref 36.0–46.0)
HEMOGLOBIN: 12.7 g/dL (ref 12.0–15.0)
Hemoglobin: 11.9 g/dL — ABNORMAL LOW (ref 12.0–15.0)
MCH: 27.1 pg (ref 26.0–34.0)
MCH: 27.7 pg (ref 26.0–34.0)
MCHC: 34.2 g/dL (ref 30.0–36.0)
MCHC: 34.3 g/dL (ref 30.0–36.0)
MCV: 79.3 fL (ref 78.0–100.0)
MCV: 80.9 fL (ref 78.0–100.0)
PLATELETS: 292 10*3/uL (ref 150–400)
Platelets: 271 10*3/uL (ref 150–400)
RBC: 4.68 MIL/uL (ref 3.87–5.11)
RBC: 4.29 MIL/uL (ref 3.87–5.11)
RDW: 13.1 % (ref 11.5–15.5)
RDW: 13.6 % (ref 11.5–15.5)
WBC: 5.5 10*3/uL (ref 4.0–10.5)
WBC: 4.8 10*3/uL (ref 4.0–10.5)

## 2014-08-24 LAB — TROPONIN I
Troponin I: <0.03 ng/mL (ref <0.031)
Troponin I: <0.03 ng/mL

## 2014-08-24 LAB — CREATININE, SERUM
CREATININE: 1.08 mg/dL (ref 0.50–1.10)
GFR calc Af Amer: 58 mL/min — ABNORMAL LOW (ref >90)
GFR calc non Af Amer: 50 mL/min — ABNORMAL LOW (ref >90)

## 2014-08-24 LAB — GLUCOSE, CAPILLARY
GLUCOSE-CAPILLARY: 106 mg/dL — AB (ref 70–99)
Glucose-Capillary: 190 mg/dL — ABNORMAL HIGH (ref 70–99)

## 2014-08-24 LAB — TSH: TSH: 0.93 u[IU]/mL (ref 0.350–4.500)

## 2014-08-24 LAB — CBG MONITORING, ED: Glucose-Capillary: 146 mg/dL — ABNORMAL HIGH (ref 70–99)

## 2014-08-24 MED ORDER — INSULIN ASPART 100 UNIT/ML ~~LOC~~ SOLN: 0.0000 [IU] | Freq: Every day | SUBCUTANEOUS | Status: DC

## 2014-08-24 MED ORDER — ASPIRIN EC 81 MG PO TBEC
81.0000 mg | DELAYED_RELEASE_TABLET | Freq: Every day | ORAL | Status: DC
  Administered 2014-08-25: 81 mg via ORAL
  Filled 2014-08-24 (×2): qty 1

## 2014-08-24 MED ORDER — SODIUM CHLORIDE 0.9 % IJ SOLN: 3.0000 mL | Freq: Two times a day (BID) | INTRAMUSCULAR | Status: DC

## 2014-08-24 MED ORDER — ONDANSETRON HCL 4 MG PO TABS: 4.0000 mg | ORAL_TABLET | Freq: Four times a day (QID) | ORAL | Status: DC | PRN

## 2014-08-24 MED ORDER — ATORVASTATIN CALCIUM 20 MG PO TABS
20.0000 mg | ORAL_TABLET | Freq: Every day | ORAL | Status: DC
  Administered 2014-08-24 – 2014-08-25 (×2): 20 mg via ORAL
  Filled 2014-08-24 (×2): qty 1

## 2014-08-24 MED ORDER — ONDANSETRON HCL 4 MG/2ML IJ SOLN: 4.0000 mg | Freq: Four times a day (QID) | INTRAMUSCULAR | Status: DC | PRN

## 2014-08-24 MED ORDER — CLONIDINE HCL 0.1 MG PO TABS
0.1000 mg | ORAL_TABLET | Freq: Two times a day (BID) | ORAL | Status: DC
  Administered 2014-08-24 – 2014-08-25 (×2): 0.1 mg via ORAL
  Filled 2014-08-24 (×3): qty 1

## 2014-08-24 MED ORDER — PANTOPRAZOLE SODIUM 40 MG PO TBEC: 40.0000 mg | DELAYED_RELEASE_TABLET | Freq: Every day | ORAL | Status: DC

## 2014-08-24 MED ORDER — PANTOPRAZOLE SODIUM 40 MG PO TBEC
40.0000 mg | DELAYED_RELEASE_TABLET | Freq: Every day | ORAL | Status: DC
  Administered 2014-08-24 – 2014-08-25 (×2): 40 mg via ORAL
  Filled 2014-08-24 (×2): qty 1

## 2014-08-24 MED ORDER — ALUM & MAG HYDROXIDE-SIMETH 200-200-20 MG/5ML PO SUSP: 30.0000 mL | Freq: Four times a day (QID) | ORAL | Status: DC | PRN

## 2014-08-24 MED ORDER — POTASSIUM CHLORIDE IN NACL 40-0.9 MEQ/L-% IV SOLN
INTRAVENOUS | Status: DC
  Administered 2014-08-24 – 2014-08-25 (×3): 100 mL/h via INTRAVENOUS
  Filled 2014-08-24 (×5): qty 1000

## 2014-08-24 MED ORDER — POTASSIUM CHLORIDE CRYS ER 20 MEQ PO TBCR
40.0000 meq | EXTENDED_RELEASE_TABLET | Freq: Once | ORAL | Status: AC
  Administered 2014-08-24: 40 meq via ORAL
  Filled 2014-08-24: qty 2

## 2014-08-24 MED ORDER — LOSARTAN POTASSIUM 50 MG PO TABS
50.0000 mg | ORAL_TABLET | Freq: Every day | ORAL | Status: DC
  Administered 2014-08-24 – 2014-08-25 (×2): 50 mg via ORAL
  Filled 2014-08-24 (×2): qty 1

## 2014-08-24 MED ORDER — POLYETHYLENE GLYCOL 3350 17 G PO PACK
17.0000 g | PACK | Freq: Every day | ORAL | Status: DC | PRN
  Filled 2014-08-24: qty 1

## 2014-08-24 MED ORDER — ENSURE COMPLETE PO LIQD: 237.0000 mL | Freq: Two times a day (BID) | ORAL | Status: DC

## 2014-08-24 MED ORDER — SODIUM CHLORIDE 0.9 % IV BOLUS (SEPSIS)
1000.0000 mL | Freq: Once | INTRAVENOUS | Status: AC
  Administered 2014-08-24: 1000 mL via INTRAVENOUS

## 2014-08-24 MED ORDER — ENOXAPARIN SODIUM 40 MG/0.4ML ~~LOC~~ SOLN
40.0000 mg | SUBCUTANEOUS | Status: DC
  Administered 2014-08-24: 40 mg via SUBCUTANEOUS
  Filled 2014-08-24 (×2): qty 0.4

## 2014-08-24 MED ORDER — AMLODIPINE BESYLATE 5 MG PO TABS
5.0000 mg | ORAL_TABLET | Freq: Every day | ORAL | Status: DC
  Administered 2014-08-24 – 2014-08-25 (×2): 5 mg via ORAL
  Filled 2014-08-24 (×2): qty 1

## 2014-08-24 MED ORDER — INSULIN ASPART 100 UNIT/ML ~~LOC~~ SOLN: 0.0000 [IU] | Freq: Three times a day (TID) | SUBCUTANEOUS | Status: DC

## 2014-08-24 NOTE — ED Notes (Signed)
Pt ambulatory to restroom

## 2014-08-24 NOTE — ED Provider Notes (Signed)
CSN: [id number]     Arrival date & time 08/24/14  2778 History   First MD Initiated Contact with Patient 08/24/14 0805     Chief Complaint  Patient presents with  . Weakness     (Consider location/radiation/quality/duration/timing/severity/associated sxs/prior Treatment) HPI Comments: Was about to get in the shower and became diaphoretic, dizzy, weak. Had near-syncope. Improved after sitting down and eating. No CP, but had SOB. States feeling bad since yesterday and has had intermittent abdominal pain for the past 2-3 days. Located around the umbilicus, radiates downward.  No dysuria, frequency.  Patient is a 74 y.o. female presenting with weakness. The history is provided by the patient.  Weakness This is a new problem. The current episode started 1 to 2 hours ago. The problem occurs constantly. The problem has been gradually improving. Associated symptoms include abdominal pain (intermittent, past few days, mild, sharp, central) and shortness of breath (mild). Nothing aggravates the symptoms. The symptoms are relieved by rest.    Past Medical History  Diagnosis Date  . Hypertension   . Diabetes mellitus without complication   . GERD (gastroesophageal reflux disease)   . High cholesterol   . Cataract    Past Surgical History  Procedure Laterality Date  . Cataract extraction    . Abdominal hysterectomy    . Dilation and curettage of uterus     No family history on file. History  Substance Use Topics  . Smoking status: Never Smoker   . Smokeless tobacco: Former Systems developer    Types: Snuff  . Alcohol Use: No     Comment: alcoholic   OB History    No data available     Review of Systems  Constitutional: Negative for fever.  Respiratory: Positive for shortness of breath (mild). Negative for cough.   Gastrointestinal: Positive for abdominal pain (intermittent, past few days, mild, sharp, central).  Neurological: Positive for weakness.  All other systems reviewed and are  negative.     Allergies  Amoxicillin and Aspirin  Home Medications   Prior to Admission medications   Medication Sig Start Date End Date Taking? Authorizing Provider  cloNIDine (CATAPRES) 0.1 MG tablet Take 0.1 mg by mouth 2 (two) times daily.   Yes Historical Provider, MD  hydrochlorothiazide (HYDRODIURIL) 25 MG tablet Take 25 mg by mouth daily.   Yes Historical Provider, MD  AMLODIPINE BESYLATE PO Take by mouth.    Historical Provider, MD  atorvastatin (LIPITOR) 20 MG tablet Take 20 mg by mouth daily.    Historical Provider, MD  clarithromycin (BIAXIN) 500 MG tablet Take 1 tablet (500 mg total) by mouth 2 (two) times daily. 04/01/14   Debby Freiberg, MD  esomeprazole (NEXIUM) 40 MG capsule Take 1 capsule (40 mg total) by mouth daily. 04/01/14   Debby Freiberg, MD  losartan (COZAAR) 50 MG tablet Take 50 mg by mouth daily.    Historical Provider, MD  metFORMIN (GLUCOPHAGE) 500 MG tablet Take 500 mg by mouth daily with breakfast.    Historical Provider, MD  metroNIDAZOLE (FLAGYL) 500 MG tablet Take 1 tablet (500 mg total) by mouth 2 (two) times daily. 04/01/14   Debby Freiberg, MD  naphazoline (NAPHCON) 0.1 % ophthalmic solution Place 1 drop into the left eye 4 (four) times daily as needed. 05/18/12   Dot Lanes, MD  pantoprazole (PROTONIX) 20 MG tablet Take 40 mg by mouth daily.     Historical Provider, MD  potassium chloride (K-DUR) 10 MEQ tablet Take 10 mEq by mouth  2 (two) times daily.    Historical Provider, MD   BP 160/80 mmHg  Pulse 83  Temp(Src) 98.5 F (36.9 C) (Oral)  Resp 20  Ht 5\' 3"  (1.6 m)  Wt 160 lb (72.576 kg)  BMI 28.35 kg/m2  SpO2 99% Physical Exam  Constitutional: She is oriented to person, place, and time. She appears well-developed and well-nourished. No distress.  HENT:  Head: Normocephalic and atraumatic.  Mouth/Throat: Oropharynx is clear and moist.  Eyes: EOM are normal. Pupils are equal, round, and reactive to light.  Neck: Normal range of motion. Neck  supple.  Cardiovascular: Normal rate and regular rhythm.  Exam reveals no friction rub.   No murmur heard. Pulmonary/Chest: Effort normal and breath sounds normal. No respiratory distress. She has no wheezes. She has no rales.  Abdominal: Soft. She exhibits no distension. There is no tenderness. There is no rebound.  Musculoskeletal: Normal range of motion. She exhibits no edema.  Neurological: She is alert and oriented to person, place, and time.  Skin: She is not diaphoretic.  Nursing note and vitals reviewed.   ED Course  Procedures (including critical care time) Labs Review Labs Reviewed  CBG MONITORING, ED - Abnormal; Notable for the following:    Glucose-Capillary 146 (*)    All other components within normal limits  CBC  COMPREHENSIVE METABOLIC PANEL  TROPONIN I    Imaging Review Dg Chest 2 View  08/24/2014   CLINICAL DATA:  Weakness, chills.  EXAM: CHEST  2 VIEW  COMPARISON:  April 01, 2014.  FINDINGS: The heart size and mediastinal contours are within normal limits. Both lungs are clear. No pneumothorax or pleural effusion is noted. Severe dextroscoliosis of mid thoracic spine is noted.  IMPRESSION: No acute cardiopulmonary abnormality seen.   Electronically Signed   By: Sabino Dick M.D.   On: 08/24/2014 09:14     EKG Interpretation   Date/Time:  Sunday August 24 2014 08:06:14 EST Ventricular Rate:  62 PR Interval:  172 QRS Duration: 100 QT Interval:  432 QTC Calculation: 438 R Axis:   69 Text Interpretation:  Normal sinus rhythm Left ventricular hypertrophy  with repolarization abnormality Abnormal ECG Repolrization abnormality new  Confirmed by Mingo Amber  MD, Marquavion Venhuizen ([id number]) on 08/24/2014 8:26:04 AM      MDM   Final diagnoses:  Near syncope  Hyponatremia  Dehydration  Hypokalemia    22F here with episode of diaphoresis, weakness, near-syncope. Improved with rest. Not back to normal now, but feeling a little better. Vitals ok.  Exam benign. No abdominal  pain. States episodes like this before she had low potassium. Will check labs. EKG with some repolarization changes, no major ischemic changes. Labs show mild hyponatremia, hypokalemia - likely dehydrated. Feeling better after fluids. Admitted for observation.  Evelina Bucy, MD 08/24/14 1027

## 2014-08-24 NOTE — ED Notes (Signed)
Attempted to call report to floor. Left call back number.

## 2014-08-24 NOTE — ED Notes (Addendum)
Patient states that she was getting dressed and became diaphoretic and weak. She states she thinks she had a "sugar attack", she ate something and states that she felt better after that. Also c/o lower back discomfort and mild left arm pain

## 2014-08-24 NOTE — H&P (Signed)
[facility] History and Physical                                                                                    Caroline Fernandez, is a 74 y.o. female  MRN: [id number]   DOB - January 25, 1941  Admit Date - 08/24/2014  Outpatient Primary MD for the patient is Pcp Not In [facility]  With History of -  Past Medical History  Diagnosis Date  . Hypertension   . Diabetes mellitus without complication   . GERD (gastroesophageal reflux disease)   . High cholesterol   . Cataract       Past Surgical History  Procedure Laterality Date  . Cataract extraction    . Abdominal hysterectomy    . Dilation and curettage of uterus      in for   Chief Complaint  Patient presents with  . Weakness     HPI    Caroline Fernandez  is a 74 y.o. female, with a past medical history of diabetes, GERD, hypertension, hyperlipidemia. She presented to [facility] after having a near syncopal event getting ready for church this morning. The patient reports that she was attempting to get dressed felt headed and dizzy and slightly short of breath. She went to the kitchen and got a bottle of water and some bread. She suddenly felt very faint. She did not lose consciousness. She called her son and asked him for help. Caroline Fernandez also reports that last Thursday and Friday she had episodes of umbilical abdominal pain and diarrhea immediately after eating. This seems to have resolved. Finally she complains of tingling feeling in her feet and abnormal pain in her fingers. She is being admitted to telemetry for a near syncopal event.  Review of Systems   In addition to the HPI above,  No Fever-chills, No Headache, No changes with Vision or hearing, No problems swallowing food or Liquids, No Blood in stool or Urine, No dysuria, No new skin rashes or bruises, No recent weight gain or loss, No polyuria, polydypsia or polyphagia, A full 10 point Review of Systems was done, except as  stated above, all other Review of Systems were negative.  Social History History  Substance Use Topics  . Smoking status: Never Smoker   . Smokeless tobacco: Former Systems developer    Types: Snuff  . Alcohol Use: No     Comment: alcoholic   she lives with her son and daughter, and is independent with her ADLs  Family History No family history on file.  Her mother passed with a myocardial infarction at age 52. Her father died of heart disease in his 82s.  Prior to Admission medications   Medication Sig Start Date End Date Taking? Authorizing Provider  cloNIDine (CATAPRES) 0.1 MG tablet Take 0.1 mg by mouth 2 (two) times daily.   Yes Historical Provider, MD  hydrochlorothiazide (HYDRODIURIL) 25 MG tablet Take 25 mg by mouth daily.   Yes Historical Provider, MD  AMLODIPINE BESYLATE PO Take by mouth.    Historical Provider, MD  atorvastatin (LIPITOR) 20 MG tablet Take 20 mg by mouth daily.    Historical  Provider, MD  clarithromycin (BIAXIN) 500 MG tablet Take 1 tablet (500 mg total) by mouth 2 (two) times daily. 04/01/14   Debby Freiberg, MD  esomeprazole (NEXIUM) 40 MG capsule Take 1 capsule (40 mg total) by mouth daily. 04/01/14   Debby Freiberg, MD  losartan (COZAAR) 50 MG tablet Take 50 mg by mouth daily.    Historical Provider, MD  metFORMIN (GLUCOPHAGE) 500 MG tablet Take 500 mg by mouth daily with breakfast.    Historical Provider, MD  metroNIDAZOLE (FLAGYL) 500 MG tablet Take 1 tablet (500 mg total) by mouth 2 (two) times daily. 04/01/14   Debby Freiberg, MD  naphazoline (NAPHCON) 0.1 % ophthalmic solution Place 1 drop into the left eye 4 (four) times daily as needed. 05/18/12   Dot Lanes, MD  pantoprazole (PROTONIX) 20 MG tablet Take 40 mg by mouth daily.     Historical Provider, MD  potassium chloride (K-DUR) 10 MEQ tablet Take 10 mEq by mouth 2 (two) times daily.    Historical Provider, MD    Allergies  Allergen Reactions  . Amoxicillin   . Aspirin     Physical  Exam  Vitals  Blood pressure 157/87, pulse 60, temperature 98.1 F (36.7 C), temperature source Oral, resp. rate 17, height 5\' 3"  (1.6 m), weight 72.576 kg (160 lb), SpO2 100 %.   General: Well-developed, well-nourished, pleasant female lying in bed in NAD, appears stated age  Psych:  Normal affect and insight, Not Suicidal or Homicidal, Awake Alert, Oriented X 3.  Neuro:   No F.N deficits, ALL C.Nerves Intact, Strength 5/5 all 4 extremities, Sensation intact all 4 extremities.  ENT:  Ears and Eyes appear Normal, Conjunctivae clear, PER. Moist oral mucosa without erythema or exudates.  Neck:  Supple, No lymphadenopathy appreciated  Respiratory:  Symmetrical chest wall movement, Good air movement bilaterally, CTAB.  Cardiac:  RRR, No Murmurs, no LE edema noted, no JVD.    Abdomen:  Positive bowel sounds, Soft, tender in the lower quadrants to mild palpation, Non distended,  No masses appreciated  Skin:  No Cyanosis, Normal Skin Turgor, No Skin Rash or Bruise.  Extremities:  Able to move all 4. 5/5 strength in each, no effusions.  Data Review  CBC  Recent Labs Lab 08/24/14 0920  WBC 5.5  HGB 12.7  HCT 37.1  PLT 292  MCV 79.3  MCH 27.1  MCHC 34.2  RDW 13.1    Chemistries   Recent Labs Lab 08/24/14 0920  NA 127*  K 3.0*  CL 89*  CO2 30  GLUCOSE 133*  BUN 12  CREATININE 1.06  CALCIUM 9.6  AST 24  ALT 16  ALKPHOS 88  BILITOT 1.2     Cardiac Enzymes  Recent Labs Lab 08/24/14 0920  TROPONINI <0.03    Invalid input(s): POCBNP   Imaging results:   Dg Chest 2 View  08/24/2014   CLINICAL DATA:  Weakness, chills.  EXAM: CHEST  2 VIEW  COMPARISON:  April 01, 2014.  FINDINGS: The heart size and mediastinal contours are within normal limits. Both lungs are clear. No pneumothorax or pleural effusion is noted. Severe dextroscoliosis of mid thoracic spine is noted.  IMPRESSION: No acute cardiopulmonary abnormality seen.   Electronically Signed   By: Sabino Dick M.D.   On: 08/24/2014 09:14    My personal review of EKG: NSR, No ST changes noted.   Assessment & Plan  Active Problems:   Near syncope   Abdominal pain, epigastric  Hyponatremia   Hypokalemia   GERD (gastroesophageal reflux disease)   HTN (hypertension)   HLD (hyperlipidemia)   Diabetes  Near syncopal event Uncertain etiology. Does not have obvious infectious or cardiac cause. Could have been related to BP, cbg, medications, or electrolyte abnormalities. Will check a 2 D echo, carotid Dopplers, TSH, urinalysis, Hgb A1C, and one troponin.  Will check orthostatic vital signs, and request PT evaluation .  Abdominal pain and diarrhea after eating Possibly due to gallbladder dysfunction or peptic ulcer disease. LFTs appear normal, but will check an abdominal ultrasound.  Will place patient on Protonix.  Hyponatremia and hypo-chloremia Likely due to dehydration. We will hold HCTZ And give gentle IV fluids  Hypokalemia Possibly due to GI losses. We will replete in IV fluids and oral supplement.  Hypertension Moderately elevated. Hold HCTZ. Continue clonidine, amlodipine, and losartan.  Diabetes mellitus On metformin at home. We'll check a hemoglobin A1c and place on sliding scale sensitive.    DVT Prophylaxis: lovenox  AM Labs Ordered, also please review Full Orders  Family Communication:   None at bedside.  Code Status:  full  Condition:  Guarded.  Time spent in minutes : 60   York, Bobby Rumpf PA-C on 08/24/2014 at 2:36 PM  Between 7am to 7pm - Pager - [phone]  After 7pm go to [email or web address] - password [email or web address]  And look for the night coverage person covering me after hours  [facility]   Attending Patient was seen, examined,treatment plan was discussed with the  Advance Practice Provider.  I have directly reviewed the clinical findings, lab, imaging studies and management of this patient in detail. I have made the necessary changes to  the above noted documentation, and agree with the documentation, as recorded by the Advance Practice Provider.   Nena Alexander MD [facility].

## 2014-08-25 ENCOUNTER — Observation Stay (HOSPITAL_COMMUNITY): Payer: Medicare Other

## 2014-08-25 DIAGNOSIS — E876 Hypokalemia: Secondary | ICD-10-CM | POA: Diagnosis not present

## 2014-08-25 DIAGNOSIS — E871 Hypo-osmolality and hyponatremia: Secondary | ICD-10-CM | POA: Diagnosis not present

## 2014-08-25 DIAGNOSIS — R55 Syncope and collapse: Secondary | ICD-10-CM

## 2014-08-25 DIAGNOSIS — R1013 Epigastric pain: Secondary | ICD-10-CM | POA: Diagnosis not present

## 2014-08-25 LAB — HEPATIC FUNCTION PANEL
ALT: 12 U/L (ref 0–35)
AST: 17 U/L (ref 0–37)
Albumin: 3.5 g/dL (ref 3.5–5.2)
Alkaline Phosphatase: 69 U/L (ref 39–117)
Total Bilirubin: 0.8 mg/dL (ref 0.3–1.2)
Total Protein: 6.5 g/dL (ref 6.0–8.3)

## 2014-08-25 LAB — BASIC METABOLIC PANEL
Anion gap: 5 (ref 5–15)
BUN: 10 mg/dL (ref 6–23)
CO2: 28 mmol/L (ref 19–32)
Calcium: 9.5 mg/dL (ref 8.4–10.5)
Chloride: 104 mmol/L (ref 96–112)
Creatinine, Ser: 1.04 mg/dL (ref 0.50–1.10)
GFR calc Af Amer: 61 mL/min — ABNORMAL LOW (ref >90)
GFR calc non Af Amer: 53 mL/min — ABNORMAL LOW (ref >90)
GLUCOSE: 114 mg/dL — AB (ref 70–99)
Potassium: 4.2 mmol/L (ref 3.5–5.1)
SODIUM: 137 mmol/L (ref 135–145)

## 2014-08-25 LAB — HEMOGLOBIN A1C
HEMOGLOBIN A1C: 6.9 % — AB (ref 4.8–5.6)
Mean Plasma Glucose: 151 mg/dL

## 2014-08-25 LAB — GLUCOSE, CAPILLARY
GLUCOSE-CAPILLARY: 115 mg/dL — AB (ref 70–99)
GLUCOSE-CAPILLARY: 82 mg/dL (ref 70–99)
Glucose-Capillary: 98 mg/dL (ref 70–99)

## 2014-08-25 MED ORDER — HYDROCHLOROTHIAZIDE 25 MG PO TABS: 25.0000 mg | ORAL_TABLET | Freq: Every day | ORAL | Status: DC

## 2014-08-25 NOTE — Progress Notes (Signed)
Pt/family given discharge instructions, medication lists, follow up appointments, and when to call the doctor.  Pt/family verbalizes understanding. Lenny Bouchillon McClintock, RN   

## 2014-08-25 NOTE — Discharge Summary (Addendum)
[facility]  Physician Discharge Summary   Patient ID: Brehanna Deveny MRN: [id number] DOB/AGE: March 21, 1941 74 y.o.  Admit date: 08/24/2014 Discharge date: 08/25/2014  PCP: Lorelei Pont, MD  DISCHARGE DIAGNOSES:  Active Problems:   Near syncope   Abdominal pain, epigastric   Hyponatremia   Hypokalemia   GERD (gastroesophageal reflux disease)   HTN (hypertension)   HLD (hyperlipidemia)   Diabetes   RECOMMENDATIONS FOR OUTPATIENT FOLLOW UP: 1. Patient to follow up with PCP regarding abdominal discomfort.  2. Bmet at follow up   DISCHARGE CONDITION: fair  Diet recommendation: Mod Carb  Filed Weights   08/24/14 0755  Weight: 72.576 kg (160 lb)    INITIAL HISTORY: Lilyona Richner is a 74 y.o. female, with a past medical history of diabetes, GERD, hypertension, hyperlipidemia. She presented to [facility] after having a near syncopal event while getting ready for church. The patient reports that she was attempting to get dressed felt headed and dizzy and slightly short of breath. She went to the kitchen and got a bottle of water and some bread. She suddenly felt very faint. She did not lose consciousness. She called her son and asked him for help.   Consultations:  None  Procedures: Carotid Doppler Preliminary findings: Bilateral: 1-39% ICA stenosis. Vertebral artery flow is antegrade  2D ECHO Study Conclusions - Left ventricle: The cavity size was normal. There was mildconcentric hypertrophy. Systolic function was normal. Theestimated ejection fraction was in the range of 55% to 60%.Doppler parameters are consistent with abnormal left ventricularrelaxation (grade 1 diastolic dysfunction). - Aortic valve: There was trivial regurgitation. - Tricuspid valve: There was mild-moderate regurgitation directedcentrally. - Pulmonary arteries: Systolic pressure was mildly increased. PApeak pressure: 44 mm Hg (S).  HOSPITAL COURSE:   Near syncopal  event Patient reported several episodes of diarrhea last week. She was noted to be hyponatremic at admission. She is on HCTZ which could have made it worse as well. I think her episode was likely due to dehydration. After hydration her sodium level has improved. Orthostatics are normal. No obvious infectious or cardiac cause. Carotid dopplers and ECHo as above and not very significant. She was seen by PT. She is back to baseline and ok for discharge.  Abdominal pain and diarrhea after eating She has had vague abdominal discomfort for many months. Has had this evaluated by her PCP. Reports unremarkable colonoscopy few years ago. No discomfort currently. [place] is unremarkable. She can follow up with her PCP for further evaluation.   Hyponatremia and hypo-chloremia Likely due to diarrhea and diuretics. Resolved with IVF.   Hypokalemia Possibly due to GI losses and diuretics. Repleted.   Essential Hypertension Continue home medications. Resume HCTZ after 3 days.   Diabetes mellitus 2 Continue home meds. HBA1c was 6.9.   Other work up unremarkable. She is ok for discharge.   PERTINENT LABS:  The results of significant diagnostics from this hospitalization (including imaging, microbiology, ancillary and laboratory) are listed below for reference.     Labs: Basic Metabolic Panel:  Recent Labs Lab 08/24/14 0920 08/24/14 1646 08/25/14 0452  NA 127*  --  137  K 3.0*  --  4.2  CL 89*  --  104  CO2 30  --  28  GLUCOSE 133*  --  114*  BUN 12  --  10  CREATININE 1.06 1.08 1.04  CALCIUM 9.6  --  9.5   Liver Function Tests:  Recent Labs Lab 08/24/14 0920 08/25/14 0452  AST 24 17  ALT 16 12  ALKPHOS 88 69  BILITOT 1.2 0.8  PROT 8.3 6.5  ALBUMIN 4.6 3.5   CBC:  Recent Labs Lab 08/24/14 0920 08/24/14 1646  WBC 5.5 4.8  HGB 12.7 11.9*  HCT 37.1 34.7*  MCV 79.3 80.9  PLT 292 271   Cardiac Enzymes:  Recent Labs Lab 08/24/14 0920 08/24/14 1646  TROPONINI <0.03 <0.03     CBG:  Recent Labs Lab 08/24/14 0803 08/24/14 1236 08/24/14 1601 08/25/14 0619 08/25/14 1133  GLUCAP 146* 106* 190* 115* 98     IMAGING STUDIES Dg Chest 2 View  08/24/2014   CLINICAL DATA:  Weakness, chills.  EXAM: CHEST  2 VIEW  COMPARISON:  April 01, 2014.  FINDINGS: The heart size and mediastinal contours are within normal limits. Both lungs are clear. No pneumothorax or pleural effusion is noted. Severe dextroscoliosis of mid thoracic spine is noted.  IMPRESSION: No acute cardiopulmonary abnormality seen.   Electronically Signed   By: Sabino Dick M.D.   On: 08/24/2014 09:14   US Abdomen Complete  08/25/2014   CLINICAL DATA:  Pain aggravated by eating and drinking. Symptoms for few weeks.  EXAM: ULTRASOUND ABDOMEN COMPLETE  COMPARISON:  CT 10/15/2012  FINDINGS: Gallbladder: No gallstones or wall thickening visualized. No sonographic Murphy sign noted.  Common bile duct: Diameter: 0.3 cm  Liver: No focal lesion identified. Within normal limits in parenchymal echogenicity.  IVC: No abnormality visualized.  Pancreas: Visualized portion unremarkable.  Spleen: Size and appearance within normal limits.  Right Kidney: Length: 10.1 cm. Echogenicity within normal limits. No mass or hydronephrosis visualized.  Left Kidney: Length: 10.9 cm. Echogenicity within normal limits. No mass or hydronephrosis visualized. Limited evaluation of the left kidney due to bowel gas.  Abdominal aorta: No aneurysm visualized.  Other findings: None.  IMPRESSION: Negative abdominal ultrasound.   Electronically Signed   By: Markus Daft M.D.   On: 08/25/2014 16:10    DISCHARGE EXAMINATION: Filed Vitals:   08/24/14 1217 08/24/14 2010 08/25/14 0502 08/25/14 1300  BP: 157/87 101/60 135/62 136/71  Pulse: 60 72 65 58  Temp: 98.1 F (36.7 C) 98.8 F (37.1 C) 98.6 F (37 C) 97.9 F (36.6 C)  TempSrc: Oral Oral Oral Oral  Resp: 17 18 18 18   Height:      Weight:      SpO2: 100% 98% 98% 100%   General  appearance: alert, cooperative, appears stated age and no distress Resp: clear to auscultation bilaterally Cardio: regular rate and rhythm, S1, S2 normal, no murmur, click, rub or gallop GI: soft, non-tender; bowel sounds normal; no masses,  no organomegaly Extremities: extremities normal, atraumatic, no cyanosis or edema Neurologic: Alert and oriented X 3, normal strength and tone. Normal symmetric reflexes. Normal coordination and gait  DISPOSITION: Home  Discharge Instructions    Call MD for:  difficulty breathing, headache or visual disturbances    Complete by:  As directed      Call MD for:  extreme fatigue    Complete by:  As directed      Call MD for:  persistant dizziness or light-headedness    Complete by:  As directed      Call MD for:  persistant nausea and vomiting    Complete by:  As directed      Call MD for:  severe uncontrolled pain    Complete by:  As directed      Diet Carb Modified    Complete by:  As directed  Discharge instructions    Complete by:  As directed   Please follow up with your PCP regarding your abdominal discomfort. Please have your Sodium and potassium levels checked at followup.     Increase activity slowly    Complete by:  As directed            ALLERGIES:  Allergies  Allergen Reactions  . Amoxicillin   . Aspirin     "makes her nervous"     Current Discharge Medication List    CONTINUE these medications which have CHANGED   Details  hydrochlorothiazide (HYDRODIURIL) 25 MG tablet Take 1 tablet (25 mg total) by mouth daily. Resume after 3 days and have your PCP check your sodium and potassium levels next week.      CONTINUE these medications which have NOT CHANGED   Details  atorvastatin (LIPITOR) 20 MG tablet Take 20 mg by mouth daily.    cholecalciferol (VITAMIN D) 400 UNITS TABS tablet Take 800 Units by mouth daily.    cloNIDine (CATAPRES) 0.1 MG tablet Take 0.1 mg by mouth daily.     metFORMIN (GLUCOPHAGE) 500 MG tablet  Take 500 mg by mouth daily with breakfast.    pantoprazole (PROTONIX) 20 MG tablet Take 40 mg by mouth daily.     potassium chloride (K-DUR) 10 MEQ tablet Take 20 mEq by mouth daily.     esomeprazole (NEXIUM) 40 MG capsule Take 1 capsule (40 mg total) by mouth daily. Qty: 30 capsule, Refills: 0    naphazoline (NAPHCON) 0.1 % ophthalmic solution Place 1 drop into the left eye 4 (four) times daily as needed. Qty: 15 mL, Refills: 0      STOP taking these medications     clarithromycin (BIAXIN) 500 MG tablet      metroNIDAZOLE (FLAGYL) 500 MG tablet      AMLODIPINE BESYLATE PO      losartan (COZAAR) 50 MG tablet        Follow-up Information    Follow up with PARUCHURI,VAMSEE, MD. Schedule an appointment as soon as possible for a visit in 1 week.   Specialty:  Internal Medicine   Why:  for blood work (bmet) and post hospitalization follow up      [place]: 35 mins  [facility] Pager [phone]  08/25/2014, 4:35 PM

## 2014-08-25 NOTE — Progress Notes (Signed)
  Echocardiogram 2D Echocardiogram has been performed.  Darlina Sicilian M 08/25/2014, 11:23 AM

## 2014-08-25 NOTE — Progress Notes (Signed)
Nutrition Brief Note  Patient identified on the Malnutrition Screening Tool (MST) Report  Per pt she has lost some weight in the last 2 months due to losing her husband. Her daughter and grandson live with her but handle food separately.  24 hr recall revealed pt consuming 100% of her needs, before husband passed pt likely was overeating. Pt cooks breakfast, eats out at lunch, and cooks dinner. Pt did report that she is unable to afford fresh fruits. We discussed good alternatives and suggestions for limiting breakfast meat to allow her more money to spend on more healthy items.  Pt is hopeful for d/c today.   Wt Readings from Last 15 Encounters:  08/24/14 160 lb (72.576 kg)  04/01/14 160 lb (72.576 kg)  05/18/12 165 lb (74.844 kg)    Body mass index is 28.35 kg/(m^2). Patient meets criteria for overweigth based on current BMI.   Current diet order is CHO Modified, patient is consuming approximately 100% of meals at this time. Labs and medications reviewed.   No nutrition interventions warranted at this time. If nutrition issues arise, please consult RD.   [place], [place], [place] Pager [phone] After Hours Pager

## 2014-08-25 NOTE — Progress Notes (Signed)
*  PRELIMINARY RESULTS* Vascular Ultrasound Carotid Duplex (Doppler) has been completed.  Preliminary findings: Bilateral:  1-39% ICA stenosis.  Vertebral artery flow is antegrade.      Landry Mellow, RDMS, RVT  08/25/2014, 2:58 PM

## 2014-08-25 NOTE — Evaluation (Signed)
Physical Therapy Evaluation and Discharge Patient Details Name: Caroline Fernandez MRN: [id number] DOB: [birth year] Today's Date: 08/25/2014   History of Present Illness  Caroline Fernandez  is a 74 y.o. female, with a past medical history of diabetes, GERD, hypertension, hyperlipidemia. She presented to [facility] after having a near syncopal event getting ready for church this morning. The patient reports that she was attempting to get dressed felt headed and dizzy and slightly short of breath.  Clinical Impression  Pt functioning at baseline and was unable to reproduce dizziness symptoms during mobility assessment. Pt with minimal falls risk as indicated by 23/24 score on DGI. Pt safe to d/c home with family once medically stable. Pt with no further acute rehab needs, PT SIGNING OFF. Please re-consult if needed in future.    Follow Up Recommendations No PT follow up    Equipment Recommendations  None recommended by PT    Recommendations for Other Services       Precautions / Restrictions Precautions Precautions: None Restrictions Weight Bearing Restrictions: No      Mobility  Bed Mobility Overal bed mobility: Modified Independent                Transfers Overall transfer level: Modified independent Equipment used: None             General transfer comment: no episodes of instability  Ambulation/Gait Ambulation/Gait assistance: Modified independent (Device/Increase time) Ambulation Distance (Feet): 250 Feet Assistive device: None Gait Pattern/deviations: Step-through pattern   Gait velocity interpretation: at or above normal speed for age/gender General Gait Details: no episodes of instability or dizziness  Stairs Stairs: Yes Stairs assistance: Modified independent (Device/Increase time) Stair Management: One rail Right Number of Stairs: 3    Wheelchair Mobility    Modified Rankin (Stroke Patients Only)       Balance Overall balance  assessment: Independent                               Standardized Balance Assessment Standardized Balance Assessment : Dynamic Gait Index   Dynamic Gait Index Level Surface: Normal Change in Gait Speed: Normal Gait with Horizontal Head Turns: Normal Gait with Vertical Head Turns: Normal Gait and Pivot Turn: Normal Step Over Obstacle: Normal Step Around Obstacles: Normal Steps: Mild Impairment Total Score: 23       Pertinent Vitals/Pain Pain Assessment: No/denies pain    Home Living Family/patient expects to be discharged to:: Private residence Living Arrangements: Children (dtr and grandson (39)) Available Help at Discharge: Family;Available 24 hours/day Type of Home: House Home Access: Level entry     Home Layout: One level Home Equipment: None      Prior Function Level of Independence: Independent               Hand Dominance   Dominant Hand: Right    Extremity/Trunk Assessment   Upper Extremity Assessment: Overall WFL for tasks assessed           Lower Extremity Assessment: Overall WFL for tasks assessed      Cervical / Trunk Assessment: Normal  Communication   Communication: No difficulties  Cognition Arousal/Alertness: Awake/alert Behavior During Therapy: WFL for tasks assessed/performed Overall Cognitive Status: Within Functional Limits for tasks assessed                      General Comments      Exercises  Assessment/Plan    PT Assessment Patient needs continued PT services  PT Diagnosis Generalized weakness   PT Problem List Decreased strength;Decreased range of motion;Decreased activity tolerance;Decreased balance;Decreased mobility  PT Treatment Interventions     PT Goals (Current goals can be found in the Care Plan section) Acute Rehab PT Goals Patient Stated Goal: home today PT Goal Formulation: All assessment and education complete, DC therapy    Frequency     Barriers to discharge         Co-evaluation               End of Session Equipment Utilized During Treatment: Gait belt Activity Tolerance: Patient tolerated treatment well Patient left: in chair;with call bell/phone within reach Nurse Communication: Mobility status    Functional Assessment Tool Used: clinical judgement Functional Limitation: Mobility: Walking and moving around Mobility: Walking and Moving Around Current Status [phone]): 0 percent impaired, limited or restricted Mobility: Walking and Moving Around Goal Status [phone]): 0 percent impaired, limited or restricted Mobility: Walking and Moving Around Discharge Status [phone]): 0 percent impaired, limited or restricted    Time: 2130-8657 PT Time Calculation (min) (ACUTE ONLY): 26 min   Charges:   PT Evaluation $Initial PT Evaluation Tier I: 1 Procedure PT Treatments $Gait Training: 8-22 mins   PT G Codes:   PT G-Codes **NOT FOR INPATIENT CLASS** Functional Assessment Tool Used: clinical judgement Functional Limitation: Mobility: Walking and moving around Mobility: Walking and Moving Around Current Status (Q4696): 0 percent impaired, limited or restricted Mobility: Walking and Moving Around Goal Status (E9528): 0 percent impaired, limited or restricted Mobility: Walking and Moving Around Discharge Status [phone]): 0 percent impaired, limited or restricted    Caroline Fernandez 08/25/2014, 9:40 AM   Caroline Fernandez, PT, DPT Pager #: [phone] Office #: [phone]

## 2014-08-25 NOTE — Discharge Instructions (Signed)
Near-Syncope °Near-syncope (commonly known as near fainting) is sudden weakness, dizziness, or feeling like you might pass out. During an episode of near-syncope, you may also develop pale skin, have tunnel vision, or feel sick to your stomach (nauseous). Near-syncope may occur when getting up after sitting or while standing for a long time. It is caused by a sudden decrease in blood flow to the brain. This decrease can result from various causes or triggers, most of which are not serious. However, because near-syncope can sometimes be a sign of something serious, a medical evaluation is required. The specific cause is often not determined. °HOME CARE INSTRUCTIONS  °Monitor your condition for any changes. The following actions may help to alleviate any discomfort you are experiencing: °· Have someone stay with you until you feel stable. °· Lie down right away and prop your feet up if you start feeling like you might faint. Breathe deeply and steadily. Wait until all the symptoms have passed. Most of these episodes last only a few minutes. You may feel tired for several hours.   °· Drink enough fluids to keep your urine clear or pale yellow.   °· If you are taking blood pressure or heart medicine, get up slowly when seated or lying down. Take several minutes to sit and then stand. This can reduce dizziness. °· Follow up with your health care provider as directed.  °SEEK IMMEDIATE MEDICAL CARE IF:  °· You have a severe headache.   °· You have unusual pain in the chest, abdomen, or back.   °· You are bleeding from the mouth or rectum, or you have black or tarry stool.   °· You have an irregular or very fast heartbeat.   °· You have repeated fainting or have seizure-like jerking during an episode.   °· You faint when sitting or lying down.   °· You have confusion.   °· You have difficulty walking.   °· You have severe weakness.   °· You have vision problems.   °MAKE SURE YOU:  °· Understand these instructions. °· Will  watch your condition. °· Will get help right away if you are not doing well or get worse. °Document Released: 07/11/2005 Document Revised: 07/16/2013 Document Reviewed: 12/14/2012 °ExitCare® Patient Information ©2015 [employer]. This information is not intended to replace advice given to you by your health care provider. Make sure you discuss any questions you have with your health care provider. °Hyponatremia  °Hyponatremia is when the amount of salt (sodium) in your blood is too low. When sodium levels are low, your cells will absorb extra water and swell. The swelling happens throughout the body, but it mostly affects the brain. Severe brain swelling (cerebral edema), seizures, or coma can happen.  °CAUSES  °· Heart, kidney, or liver problems. °· Thyroid problems. °· Adrenal gland problems. °· Severe vomiting and diarrhea. °· Certain medicines or illegal drugs. °· Dehydration. °· Drinking too much water. °· Low-sodium diet. °SYMPTOMS  °· Nausea and vomiting. °· Confusion. °· Lethargy. °· Agitation. °· Headache. °· Twitching or shaking (seizures). °· Unconsciousness. °· Appetite loss. °· Muscle weakness and cramping. °DIAGNOSIS  °Hyponatremia is identified by a simple blood test. Your caregiver will perform a history and physical exam to try to find the cause and type of hyponatremia. Other tests may be needed to measure the amount of sodium in your blood and urine. °TREATMENT  °Treatment will depend on the cause.  °· Fluids may be given through the vein (IV). °· Medicines may be used to correct the sodium imbalance. If medicines are causing   the problem, they will need to be adjusted. °· Water or fluid intake may be restricted to restore proper balance. °The speed of correcting the sodium problem is very important. If the problem is corrected too fast, nerve damage (sometimes unchangeable) can happen. °HOME CARE INSTRUCTIONS  °· Only take medicines as directed by your caregiver. Many medicines can make  hyponatremia worse. Discuss all your medicines with your caregiver. °· Carefully follow any recommended diet, including any fluid restrictions. °· You may be asked to repeat lab tests. Follow these directions. °· Avoid alcohol and recreational drugs. °SEEK MEDICAL CARE IF:  °· You develop worsening nausea, fatigue, headache, confusion, or weakness. °· Your original hyponatremia symptoms return. °· You have problems following the recommended diet. °SEEK IMMEDIATE MEDICAL CARE IF:  °· You have a seizure. °· You faint. °· You have ongoing diarrhea or vomiting. °MAKE SURE YOU:  °· Understand these instructions. °· Will watch your condition. °· Will get help right away if you are not doing well or get worse. °Document Released: 07/01/2002 Document Revised: 10/03/2011 Document Reviewed: 12/26/2010 °ExitCare® Patient Information ©2015 [employer]. This information is not intended to replace advice given to you by your health care provider. Make sure you discuss any questions you have with your health care provider. ° °

## 2014-08-25 NOTE — Progress Notes (Signed)
UR completed 

## 2014-10-08 ENCOUNTER — Encounter (HOSPITAL_BASED_OUTPATIENT_CLINIC_OR_DEPARTMENT_OTHER): Payer: Self-pay

## 2014-10-08 ENCOUNTER — Observation Stay (HOSPITAL_BASED_OUTPATIENT_CLINIC_OR_DEPARTMENT_OTHER)
Admission: EM | Admit: 2014-10-08 | Discharge: 2014-10-10 | Disposition: A | Discharge status: Home / Self Care | Payer: Medicare Other | Attending: Internal Medicine | Admitting: Internal Medicine

## 2014-10-08 DIAGNOSIS — E876 Hypokalemia: Secondary | ICD-10-CM | POA: Diagnosis not present

## 2014-10-08 DIAGNOSIS — Z6831 Body mass index (BMI) 31.0-31.9, adult: Secondary | ICD-10-CM | POA: Diagnosis not present

## 2014-10-08 DIAGNOSIS — I2510 Atherosclerotic heart disease of native coronary artery without angina pectoris: Secondary | ICD-10-CM | POA: Diagnosis not present

## 2014-10-08 DIAGNOSIS — H538 Other visual disturbances: Secondary | ICD-10-CM | POA: Diagnosis present

## 2014-10-08 DIAGNOSIS — Z961 Presence of intraocular lens: Secondary | ICD-10-CM | POA: Diagnosis not present

## 2014-10-08 DIAGNOSIS — R001 Bradycardia, unspecified: Secondary | ICD-10-CM | POA: Diagnosis not present

## 2014-10-08 DIAGNOSIS — R42 Dizziness and giddiness: Secondary | ICD-10-CM

## 2014-10-08 DIAGNOSIS — Z9842 Cataract extraction status, left eye: Secondary | ICD-10-CM | POA: Diagnosis not present

## 2014-10-08 DIAGNOSIS — I5032 Chronic diastolic (congestive) heart failure: Secondary | ICD-10-CM | POA: Diagnosis not present

## 2014-10-08 DIAGNOSIS — E119 Type 2 diabetes mellitus without complications: Secondary | ICD-10-CM | POA: Diagnosis not present

## 2014-10-08 DIAGNOSIS — Z87891 Personal history of nicotine dependence: Secondary | ICD-10-CM | POA: Diagnosis not present

## 2014-10-08 DIAGNOSIS — Z881 Allergy status to other antibiotic agents status: Secondary | ICD-10-CM | POA: Insufficient documentation

## 2014-10-08 DIAGNOSIS — E780 Pure hypercholesterolemia: Secondary | ICD-10-CM | POA: Insufficient documentation

## 2014-10-08 DIAGNOSIS — R1032 Left lower quadrant pain: Secondary | ICD-10-CM | POA: Insufficient documentation

## 2014-10-08 DIAGNOSIS — Z886 Allergy status to analgesic agent status: Secondary | ICD-10-CM | POA: Insufficient documentation

## 2014-10-08 DIAGNOSIS — Z9851 Tubal ligation status: Secondary | ICD-10-CM | POA: Diagnosis not present

## 2014-10-08 DIAGNOSIS — M545 Low back pain: Secondary | ICD-10-CM | POA: Diagnosis not present

## 2014-10-08 DIAGNOSIS — K219 Gastro-esophageal reflux disease without esophagitis: Secondary | ICD-10-CM | POA: Insufficient documentation

## 2014-10-08 DIAGNOSIS — I10 Essential (primary) hypertension: Secondary | ICD-10-CM | POA: Diagnosis present

## 2014-10-08 DIAGNOSIS — Z90710 Acquired absence of both cervix and uterus: Secondary | ICD-10-CM | POA: Diagnosis not present

## 2014-10-08 DIAGNOSIS — G8929 Other chronic pain: Secondary | ICD-10-CM | POA: Diagnosis not present

## 2014-10-08 DIAGNOSIS — G453 Amaurosis fugax: Secondary | ICD-10-CM | POA: Diagnosis not present

## 2014-10-08 HISTORY — DX: Atherosclerotic heart disease of native coronary artery without angina pectoris: I25.10

## 2014-10-08 HISTORY — DX: Disorder of kidney and ureter, unspecified: N28.9

## 2014-10-08 HISTORY — DX: Low back pain: M54.5

## 2014-10-08 HISTORY — DX: Personal history of peptic ulcer disease: Z87.11

## 2014-10-08 HISTORY — DX: Cardiac murmur, unspecified: R01.1

## 2014-10-08 HISTORY — DX: Headache: R51

## 2014-10-08 HISTORY — DX: Hemorrhagic condition, unspecified: D69.9

## 2014-10-08 HISTORY — DX: Personal history of other diseases of the digestive system: Z87.19

## 2014-10-08 HISTORY — DX: Other chronic pain: G89.29

## 2014-10-08 HISTORY — DX: Type 2 diabetes mellitus without complications: E11.9

## 2014-10-08 HISTORY — DX: Low back pain, unspecified: M54.50

## 2014-10-08 HISTORY — DX: Morbid (severe) obesity due to excess calories: E66.01

## 2014-10-08 HISTORY — DX: Headache, unspecified: R51.9

## 2014-10-08 NOTE — ED Notes (Addendum)
Pt reports dizziness that started Monday - worsens with movement - reports headaches, bilateral leg weakness. Pt reports seen on Monday at [facility] for same s/s and given meds to treat her BP and discharged home.

## 2014-10-09 ENCOUNTER — Encounter (HOSPITAL_BASED_OUTPATIENT_CLINIC_OR_DEPARTMENT_OTHER): Payer: Self-pay | Admitting: Emergency Medicine

## 2014-10-09 ENCOUNTER — Emergency Department (HOSPITAL_BASED_OUTPATIENT_CLINIC_OR_DEPARTMENT_OTHER): Payer: Medicare Other

## 2014-10-09 DIAGNOSIS — I5032 Chronic diastolic (congestive) heart failure: Secondary | ICD-10-CM

## 2014-10-09 DIAGNOSIS — E119 Type 2 diabetes mellitus without complications: Secondary | ICD-10-CM

## 2014-10-09 DIAGNOSIS — R001 Bradycardia, unspecified: Secondary | ICD-10-CM

## 2014-10-09 DIAGNOSIS — R42 Dizziness and giddiness: Secondary | ICD-10-CM

## 2014-10-09 DIAGNOSIS — E876 Hypokalemia: Secondary | ICD-10-CM | POA: Diagnosis present

## 2014-10-09 DIAGNOSIS — R1032 Left lower quadrant pain: Secondary | ICD-10-CM

## 2014-10-09 DIAGNOSIS — G453 Amaurosis fugax: Secondary | ICD-10-CM | POA: Diagnosis present

## 2014-10-09 DIAGNOSIS — I10 Essential (primary) hypertension: Secondary | ICD-10-CM | POA: Diagnosis present

## 2014-10-09 LAB — CBC
HCT: 32.9 % — ABNORMAL LOW (ref 36.0–46.0)
Hemoglobin: 11.3 g/dL — ABNORMAL LOW (ref 12.0–15.0)
MCH: 27.7 pg (ref 26.0–34.0)
MCHC: 34.3 g/dL (ref 30.0–36.0)
MCV: 80.6 fL (ref 78.0–100.0)
Platelets: 245 10*3/uL (ref 150–400)
RBC: 4.08 MIL/uL (ref 3.87–5.11)
RDW: 14 % (ref 11.5–15.5)
WBC: 6.4 10*3/uL (ref 4.0–10.5)

## 2014-10-09 LAB — BASIC METABOLIC PANEL
Anion gap: 11 (ref 5–15)
Anion gap: 8 (ref 5–15)
BUN: 15 mg/dL (ref 6–23)
BUN: 10 mg/dL (ref 6–23)
CHLORIDE: 91 mmol/L — AB (ref 96–112)
CO2: 28 mmol/L (ref 19–32)
CO2: 31 mmol/L (ref 19–32)
CREATININE: 1.09 mg/dL (ref 0.50–1.10)
Calcium: 9.3 mg/dL (ref 8.4–10.5)
Calcium: 9.4 mg/dL (ref 8.4–10.5)
Chloride: 93 mmol/L — ABNORMAL LOW (ref 96–112)
Creatinine, Ser: 1.12 mg/dL — ABNORMAL HIGH (ref 0.50–1.10)
GFR calc non Af Amer: 50 mL/min — ABNORMAL LOW (ref >90)
GFR calc non Af Amer: 48 mL/min — ABNORMAL LOW (ref >90)
GFR, EST AFRICAN AMERICAN: 58 mL/min — AB (ref >90)
GFR, EST AFRICAN AMERICAN: 56 mL/min — AB (ref >90)
GLUCOSE: 134 mg/dL — AB (ref 70–99)
Glucose, Bld: 118 mg/dL — ABNORMAL HIGH (ref 70–99)
Potassium: 2.8 mmol/L — ABNORMAL LOW (ref 3.5–5.1)
Potassium: 3.5 mmol/L (ref 3.5–5.1)
SODIUM: 132 mmol/L — AB (ref 135–145)
Sodium: 130 mmol/L — ABNORMAL LOW (ref 135–145)

## 2014-10-09 LAB — URINALYSIS, ROUTINE W REFLEX MICROSCOPIC
BILIRUBIN URINE: NEGATIVE
Glucose, UA: NEGATIVE mg/dL
Hgb urine dipstick: NEGATIVE
KETONES UR: NEGATIVE mg/dL
Leukocytes, UA: NEGATIVE
Nitrite: NEGATIVE
PROTEIN: NEGATIVE mg/dL
Specific Gravity, Urine: 1.005 (ref 1.005–1.030)
Urobilinogen, UA: 0.2 mg/dL (ref 0.0–1.0)
pH: 6.5 (ref 5.0–8.0)

## 2014-10-09 LAB — GLUCOSE, CAPILLARY
GLUCOSE-CAPILLARY: 86 mg/dL (ref 70–99)
GLUCOSE-CAPILLARY: 96 mg/dL (ref 70–99)
Glucose-Capillary: 121 mg/dL — ABNORMAL HIGH (ref 70–99)

## 2014-10-09 LAB — CBC WITH DIFFERENTIAL/PLATELET
BASOS ABS: 0 10*3/uL (ref 0.0–0.1)
BASOS PCT: 0 % (ref 0–1)
EOS ABS: 0.1 10*3/uL (ref 0.0–0.7)
Eosinophils Relative: 1 % (ref 0–5)
HCT: 33.4 % — ABNORMAL LOW (ref 36.0–46.0)
Hemoglobin: 11.7 g/dL — ABNORMAL LOW (ref 12.0–15.0)
LYMPHS PCT: 31 % (ref 12–46)
Lymphs Abs: 2.4 10*3/uL (ref 0.7–4.0)
MCH: 28.3 pg (ref 26.0–34.0)
MCHC: 35 g/dL (ref 30.0–36.0)
MCV: 80.9 fL (ref 78.0–100.0)
Monocytes Absolute: 0.5 10*3/uL (ref 0.1–1.0)
Monocytes Relative: 6 % (ref 3–12)
NEUTROS PCT: 62 % (ref 43–77)
Neutro Abs: 4.8 10*3/uL (ref 1.7–7.7)
Platelets: 225 10*3/uL (ref 150–400)
RBC: 4.13 MIL/uL (ref 3.87–5.11)
RDW: 13.5 % (ref 11.5–15.5)
WBC: 7.8 10*3/uL (ref 4.0–10.5)

## 2014-10-09 LAB — CBG MONITORING, ED: GLUCOSE-CAPILLARY: 97 mg/dL (ref 70–99)

## 2014-10-09 LAB — SEDIMENTATION RATE: SED RATE: 22 mm/h (ref 0–22)

## 2014-10-09 LAB — BRAIN NATRIURETIC PEPTIDE: B Natriuretic Peptide: 46.6 pg/mL (ref 0.0–100.0)

## 2014-10-09 MED ORDER — METFORMIN HCL 500 MG PO TABS
500.0000 mg | ORAL_TABLET | Freq: Every day | ORAL | Status: DC
  Administered 2014-10-09: 500 mg via ORAL
  Filled 2014-10-09 (×2): qty 1

## 2014-10-09 MED ORDER — PANTOPRAZOLE SODIUM 40 MG PO TBEC
40.0000 mg | DELAYED_RELEASE_TABLET | Freq: Every day | ORAL | Status: DC
  Administered 2014-10-09 – 2014-10-10 (×2): 40 mg via ORAL
  Filled 2014-10-09 (×2): qty 1

## 2014-10-09 MED ORDER — ACETAMINOPHEN 325 MG PO TABS: 650.0000 mg | ORAL_TABLET | Freq: Four times a day (QID) | ORAL | Status: DC | PRN

## 2014-10-09 MED ORDER — LISINOPRIL 5 MG PO TABS
5.0000 mg | ORAL_TABLET | Freq: Every day | ORAL | Status: DC
  Administered 2014-10-09 – 2014-10-10 (×2): 5 mg via ORAL
  Filled 2014-10-09 (×2): qty 1

## 2014-10-09 MED ORDER — INSULIN ASPART 100 UNIT/ML ~~LOC~~ SOLN: 0.0000 [IU] | Freq: Every day | SUBCUTANEOUS | Status: DC

## 2014-10-09 MED ORDER — ATORVASTATIN CALCIUM 20 MG PO TABS
20.0000 mg | ORAL_TABLET | Freq: Every day | ORAL | Status: DC
  Filled 2014-10-09: qty 1

## 2014-10-09 MED ORDER — ACETAMINOPHEN 650 MG RE SUPP: 650.0000 mg | Freq: Four times a day (QID) | RECTAL | Status: DC | PRN

## 2014-10-09 MED ORDER — INSULIN ASPART 100 UNIT/ML ~~LOC~~ SOLN: 0.0000 [IU] | Freq: Three times a day (TID) | SUBCUTANEOUS | Status: DC

## 2014-10-09 MED ORDER — SODIUM CHLORIDE 0.9 % IJ SOLN
3.0000 mL | Freq: Two times a day (BID) | INTRAMUSCULAR | Status: DC
  Administered 2014-10-09 – 2014-10-10 (×2): 3 mL via INTRAVENOUS

## 2014-10-09 MED ORDER — LIVING BETTER WITH HEART FAILURE BOOK
Freq: Once | Status: AC
  Administered 2014-10-09: 23:00:00

## 2014-10-09 MED ORDER — CLONIDINE HCL 0.1 MG PO TABS
0.1000 mg | ORAL_TABLET | Freq: Every day | ORAL | Status: DC
  Administered 2014-10-09: 0.1 mg via ORAL
  Filled 2014-10-09: qty 1

## 2014-10-09 MED ORDER — POTASSIUM CHLORIDE CRYS ER 20 MEQ PO TBCR
40.0000 meq | EXTENDED_RELEASE_TABLET | Freq: Once | ORAL | Status: AC
  Administered 2014-10-09: 40 meq via ORAL
  Filled 2014-10-09: qty 2

## 2014-10-09 MED ORDER — ONDANSETRON HCL 4 MG PO TABS: 4.0000 mg | ORAL_TABLET | Freq: Four times a day (QID) | ORAL | Status: DC | PRN

## 2014-10-09 MED ORDER — ONDANSETRON HCL 4 MG/2ML IJ SOLN: 4.0000 mg | Freq: Four times a day (QID) | INTRAMUSCULAR | Status: DC | PRN

## 2014-10-09 MED ORDER — ENOXAPARIN SODIUM 40 MG/0.4ML ~~LOC~~ SOLN
40.0000 mg | SUBCUTANEOUS | Status: DC
  Administered 2014-10-09: 40 mg via SUBCUTANEOUS
  Filled 2014-10-09 (×2): qty 0.4

## 2014-10-09 MED ORDER — POTASSIUM CHLORIDE ER 10 MEQ PO TBCR
20.0000 meq | EXTENDED_RELEASE_TABLET | Freq: Every day | ORAL | Status: DC
  Filled 2014-10-09: qty 2

## 2014-10-09 NOTE — ED Notes (Signed)
MD at bedside. 

## 2014-10-09 NOTE — ED Notes (Signed)
Pt took her own Vitamins, Lipitor and Potassium from her home medications. MD [facility] consulted and agreed

## 2014-10-09 NOTE — ED Provider Notes (Signed)
CSN: [id number]     Arrival date & time 10/08/14  2130 History   First MD Initiated Contact with Patient 10/09/14 0053     Chief Complaint  Patient presents with  . Dizziness     (Consider location/radiation/quality/duration/timing/severity/associated sxs/prior Treatment) HPI  This is a 74 year old female with a two-week history of dizziness. She is very vague in what she means by dizziness. She states she feels like her head is spinning but also she feels like she is going to pass out. It is worse with movement of her head. Over the same period of time she has had generalized malaise, nausea but no vomiting, decreased appetite, increased urination, headache and general weakness. She saw her PCP on March 7 who made no changes to her medications. She knew was seen at [facility] 3 days ago, was diagnosed with hypertension and was given "3 pills"but doesn't know what they were.  She has also been having episodes of darkening of her vision. These occur usually early in the morning when waking up but also occur during the day. The vision returns after several minutes. When her vision returns it is often blurred for a period of time before clearing.  Past Medical History  Diagnosis Date  . Hypertension   . Diabetes mellitus without complication   . GERD (gastroesophageal reflux disease)   . High cholesterol   . Cataract   . Coronary artery disease   . Renal disorder   . Morbid obesity    Past Surgical History  Procedure Laterality Date  . Cataract extraction    . Abdominal hysterectomy    . Dilation and curettage of uterus     History reviewed. No pertinent family history. History  Substance Use Topics  . Smoking status: Never Smoker   . Smokeless tobacco: Former Systems developer    Types: Snuff  . Alcohol Use: No     Comment: alcoholic   OB History    No data available     Review of Systems  All other systems reviewed and are negative.   Allergies  Amoxicillin and  Aspirin  Home Medications   Prior to Admission medications   Medication Sig Start Date End Date Taking? Authorizing Provider  atorvastatin (LIPITOR) 20 MG tablet Take 20 mg by mouth daily.   Yes Historical Provider, MD  cholecalciferol (VITAMIN D) 400 UNITS TABS tablet Take 800 Units by mouth daily.   Yes Historical Provider, MD  cloNIDine (CATAPRES) 0.1 MG tablet Take 0.1 mg by mouth daily.    Yes Historical Provider, MD  hydrochlorothiazide (HYDRODIURIL) 25 MG tablet Take 1 tablet (25 mg total) by mouth daily. Resume after 3 days and have your PCP check your sodium and potassium levels next week. 08/25/14  Yes Bonnielee Haff, MD  metFORMIN (GLUCOPHAGE) 500 MG tablet Take 500 mg by mouth daily with breakfast.   Yes Historical Provider, MD  naphazoline (NAPHCON) 0.1 % ophthalmic solution Place 1 drop into the left eye 4 (four) times daily as needed. 05/18/12  Yes Leonard Schwartz, MD  pantoprazole (PROTONIX) 20 MG tablet Take 40 mg by mouth daily.    Yes Historical Provider, MD  potassium chloride (K-DUR) 10 MEQ tablet Take 20 mEq by mouth daily.    Yes Historical Provider, MD  esomeprazole (NEXIUM) 40 MG capsule Take 1 capsule (40 mg total) by mouth daily. 04/01/14   Debby Freiberg, MD   BP 189/83 mmHg  Pulse 55  Temp(Src) 98.4 F (36.9 C) (Oral)  Resp  16  Ht 5\' 3"  (1.6 m)  Wt 176 lb (79.833 kg)  BMI 31.18 kg/m2  SpO2 99%   Physical Exam  General: Well-developed, well-nourished female in no acute distress; appearance consistent with age of record HENT: normocephalic; atraumatic Eyes: Pupils pinpoint; extraocular muscles intact; arcus senilis bilaterally Neck: supple Heart: regular rate and rhythm; bradycardia Lungs: clear to auscultation bilaterally Abdomen: soft; nondistended; nontender; no masses or hepatosplenomegaly; bowel sounds present Extremities: No deformity; full range of motion; pulses normal Neurologic: Awake, alert and oriented; motor function intact in all extremities and  symmetric; no facial droop Skin: Warm and dry Psychiatric: Flat affect    ED Course  Procedures (including critical care time)   EKG Interpretation   Date/Time:  Wednesday October 08 2014 21:41:29 EDT Ventricular Rate:  71 PR Interval:  172 QRS Duration: 88 QT Interval:  412 QTC Calculation: 447 R Axis:   53 Text Interpretation:  Normal sinus rhythm Possible Left atrial enlargement  T wave abnormality, consider inferior ischemia Abnormal ECG No significant  change since last tracing Confirmed by Mingo Amber  MD, Hancock ([id number]) on  10/08/2014 9:48:16 PM      MDM  Nursing notes and vitals signs, including pulse oximetry, reviewed.  Summary of this visit's results, reviewed by myself:  Labs:  Results for orders placed or performed during the hospital encounter of 10/08/14 (from the past 24 hour(s))  CBC with Differential/Platelet     Status: Abnormal   Collection Time: 10/09/14  1:15 AM  Result Value Ref Range   WBC 7.8 4.0 - 10.5 K/uL   RBC 4.13 3.87 - 5.11 MIL/uL   Hemoglobin 11.7 (L) 12.0 - 15.0 g/dL   HCT 33.4 (L) 36.0 - 46.0 %   MCV 80.9 78.0 - 100.0 fL   MCH 28.3 26.0 - 34.0 pg   MCHC 35.0 30.0 - 36.0 g/dL   RDW 13.5 11.5 - 15.5 %   Platelets 225 150 - 400 K/uL   Neutrophils Relative % 62 43 - 77 %   Neutro Abs 4.8 1.7 - 7.7 K/uL   Lymphocytes Relative 31 12 - 46 %   Lymphs Abs 2.4 0.7 - 4.0 K/uL   Monocytes Relative 6 3 - 12 %   Monocytes Absolute 0.5 0.1 - 1.0 K/uL   Eosinophils Relative 1 0 - 5 %   Eosinophils Absolute 0.1 0.0 - 0.7 K/uL   Basophils Relative 0 0 - 1 %   Basophils Absolute 0.0 0.0 - 0.1 K/uL  Basic metabolic panel     Status: Abnormal   Collection Time: 10/09/14  1:15 AM  Result Value Ref Range   Sodium 130 (L) 135 - 145 mmol/L   Potassium 2.8 (L) 3.5 - 5.1 mmol/L   Chloride 91 (L) 96 - 112 mmol/L   CO2 28 19 - 32 mmol/L   Glucose, Bld 118 (H) 70 - 99 mg/dL   BUN 15 6 - 23 mg/dL   Creatinine, Ser 1.09 0.50 - 1.10 mg/dL   Calcium 9.3 8.4 -  10.5 mg/dL   GFR calc non Af Amer 50 (L) >90 mL/min   GFR calc Af Amer 58 (L) >90 mL/min   Anion gap 11 5 - 15  Urinalysis, Routine w reflex microscopic     Status: None   Collection Time: 10/09/14  1:19 AM  Result Value Ref Range   Color, Urine YELLOW YELLOW   APPearance CLEAR CLEAR   Specific Gravity, Urine 1.005 1.005 - 1.030   pH 6.5 5.0 -  8.0   Glucose, UA NEGATIVE NEGATIVE mg/dL   Hgb urine dipstick NEGATIVE NEGATIVE   Bilirubin Urine NEGATIVE NEGATIVE   Ketones, ur NEGATIVE NEGATIVE mg/dL   Protein, ur NEGATIVE NEGATIVE mg/dL   Urobilinogen, UA 0.2 0.0 - 1.0 mg/dL   Nitrite NEGATIVE NEGATIVE   Leukocytes, UA NEGATIVE NEGATIVE    Imaging Studies: Ct Head Wo Contrast  10/09/2014   CLINICAL DATA:  Dizziness, 2 day duration.  Headaches.  EXAM: CT HEAD WITHOUT CONTRAST  TECHNIQUE: Contiguous axial images were obtained from the base of the skull through the vertex without intravenous contrast.  COMPARISON:  None.  FINDINGS: There is no intracranial hemorrhage, mass or evidence of acute infarction. There is mild generalized atrophy. There is mild chronic microvascular ischemic change. There is no significant extra-axial fluid collection.  No acute intracranial findings are evident.  IMPRESSION: No acute intracranial findings. Mild atrophy and microvascular changes.   Electronically Signed   By: Andreas Newport M.D.   On: 10/09/2014 02:06   2:35 AM Patient's blood pressure has improved and she states she feels better. Her visual changes or concerning for amaurosis fugax and we will have her admitted for further workup.      Shanon Rosser, MD 10/09/14 [phone]

## 2014-10-09 NOTE — ED Notes (Signed)
Call made to [facility], no beds available at this time. Waiting for an open bed to come available for patient to be transferred to [facility].

## 2014-10-09 NOTE — ED Notes (Signed)
Pt and family made aware of wait for admission bed. Pt comfortable at this time. Pt given PO fluids

## 2014-10-09 NOTE — ED Notes (Signed)
Pt provided with breakfast at this time. NAD noted. Continues to wait on inpatient bed.

## 2014-10-09 NOTE — ED Notes (Signed)
Pt denies any dizziness at this time

## 2014-10-09 NOTE — ED Notes (Signed)
Report called to Harriette Bouillon at [facility]

## 2014-10-09 NOTE — ED Notes (Signed)
Pt now has ready bed at [facility]. Bed number [id number].  [facility] en route to pick up patient at this time.

## 2014-10-09 NOTE — ED Notes (Signed)
Pt and family updated on wait. Pt is awaiting bed assignment from [facility].

## 2014-10-09 NOTE — H&P (Signed)
History and Physical  Caroline Fernandez NWG:[id number] DOB: 09/12/1940 DOA: 10/08/2014  Referring physician: Towanda Malkin, ER physician PCP: Lorelei Pont, MD   Chief Complaint: dizziness  HPI: Caroline Fernandez is a 74 y.o. female   with past history morbid obesity, diastolic heart failure and diabetes mellitus who had been evaluated a month ago and this health system and at that time complaining of dizziness and had full stroke workup which was unrevealing. At that time, her HCTZ had been discontinued. At some point this was restarted. Patient continues to complain of dizziness and weakness and came into the emergency room for further evaluation. Initial workup unrevealing, but she was noted to have hypokalemia. She is also complaining of symptoms of occasional blacking out of her vision. An ESR was checked which was normal. CT scan of the head was unrevealing. Hospitals were called for further evaluation and patient was transferred from [facility] here.  After arrival here, she was noted to have a resting heart rate of 49.   Review of Systems:  Patient seen after arrival to floor Pt complains of feeling very weak.  She does describe blurry vision as above, not currently going on.  She has intermittent random episodes of left lower quadrant abdominal pain, not always associated with food.  Pt denies any headache, dysphagia, chest pain, palpitations, shortness of breath, wheeze, cough, hematuria, dysuria, constipation, diarrhea, focal extremity numbness or weakness or pain  Review of systems are otherwise negative  Past Medical History  Diagnosis Date  . Hypertension   . GERD (gastroesophageal reflux disease)   . High cholesterol   . Coronary artery disease   . Morbid obesity   . Heart murmur   . Type II diabetes mellitus   . Bleeds easily   . History of stomach ulcers   . Daily headache     "lately" (10/09/2014)  . Chronic lower back pain   . Renal disorder    Past  Surgical History  Procedure Laterality Date  . Cataract extraction w/ intraocular lens implant Left   . Abdominal hysterectomy    . Right oophorectomy    . Left oophorectomy    . Tubal ligation    . Dilation and curettage of uterus  "several"  . Glaucoma surgery Left ~ 07/2014   Social History:  reports that she has never smoked. She has quit using smokeless tobacco. Her smokeless tobacco use included Snuff. She denies that she drinks alcohol. She reports that she does not use illicit drugs. Patient lives at home with her daughter and is normally able to participate in most activities of daily living without assistance  Allergies  Allergen Reactions  . Amoxicillin   . Aspirin     "makes her nervous"    Family history: Hypertension  Prior to Admission medications   Medication Sig Start Date End Date Taking? Authorizing Provider  atorvastatin (LIPITOR) 20 MG tablet Take 20 mg by mouth daily.   Yes Historical Provider, MD  cholecalciferol (VITAMIN D) 400 UNITS TABS tablet Take 800 Units by mouth daily.   Yes Historical Provider, MD  cloNIDine (CATAPRES) 0.1 MG tablet Take 0.1 mg by mouth daily.    Yes Historical Provider, MD  metFORMIN (GLUCOPHAGE) 500 MG tablet Take 500 mg by mouth daily with breakfast.   Yes Historical Provider, MD  naphazoline (NAPHCON) 0.1 % ophthalmic solution Place 1 drop into the left eye 4 (four) times daily as needed. 05/18/12  Yes Leonard Schwartz, MD  pantoprazole (PROTONIX) 20 MG  tablet Take 40 mg by mouth daily.    Yes Historical Provider, MD  potassium chloride (K-DUR) 10 MEQ tablet Take 20 mEq by mouth daily.    Yes Historical Provider, MD  esomeprazole (NEXIUM) 40 MG capsule Take 1 capsule (40 mg total) by mouth daily. 04/01/14   Debby Freiberg, MD    Physical Exam: BP 144/69 mmHg  Pulse 49  Temp(Src) 98.5 F (36.9 C) (Oral)  Resp 16  Ht '5\' 3"'  (1.6 m)  Wt 80.8 kg (178 lb 2.1 oz)  BMI 31.56 kg/m2  SpO2 100%  General:   Alert and oriented 3, no  acute distress Eyes: sclera nonicteric, extraocular movements are intact ENT: normocephalic may try to, mucous members are slightly dry Neck: no JVD Cardiovascular: regular rhythm, borderline bradycardia Respiratory: clear to auscultation bilaterally Abdomen: soft, obese, nontender, positive bowel sounds Skin: no skin breaks, tears or lesions Musculoskeletal: no clubbing or cyanosis, trace edema Psychiatric: patient is appropriate, no evidence of psychoses Neurologic: no focal neurological deficits           Labs on Admission:  Basic Metabolic Panel:  Recent Labs Lab 10/09/14 0115 10/09/14 1828  NA 130* 132*  K 2.8* 3.5  CL 91* 93*  CO2 28 31  GLUCOSE 118* 134*  BUN 15 10  CREATININE 1.09 1.12*  CALCIUM 9.3 9.4   Liver Function Tests: No results for input(s): AST, ALT, ALKPHOS, BILITOT, PROT, ALBUMIN in the last 168 hours. No results for input(s): LIPASE, AMYLASE in the last 168 hours. No results for input(s): AMMONIA in the last 168 hours. CBC:  Recent Labs Lab 10/09/14 0115  WBC 7.8  NEUTROABS 4.8  HGB 11.7*  HCT 33.4*  MCV 80.9  PLT 225   Cardiac Enzymes: No results for input(s): CKTOTAL, CKMB, CKMBINDEX, TROPONINI in the last 168 hours.  BNP (last 3 results) No results for input(s): BNP in the last 8760 hours.  ProBNP (last 3 results) No results for input(s): PROBNP in the last 8760 hours.  CBG:  Recent Labs Lab 10/09/14 0751 10/09/14 1559 10/09/14 1724  GLUCAP 97 121* 86    Radiological Exams on Admission: Ct Head Wo Contrast  10/09/2014   CLINICAL DATA:  Dizziness, 2 day duration.  Headaches.  EXAM: CT HEAD WITHOUT CONTRAST  TECHNIQUE: Contiguous axial images were obtained from the base of the skull through the vertex without intravenous contrast.  COMPARISON:  None.  FINDINGS: There is no intracranial hemorrhage, mass or evidence of acute infarction. There is mild generalized atrophy. There is mild chronic microvascular ischemic change. There  is no significant extra-axial fluid collection.  No acute intracranial findings are evident.  IMPRESSION: No acute intracranial findings. Mild atrophy and microvascular changes.   Electronically Signed   By: Andreas Newport M.D.   On: 10/09/2014 02:06    EKG: Independently reviewed. Normal sinus rhythm with inverted T waves in inferior leads, previously seen before. Signs of left atrial enlargement. Heart rate at EKG time was 71  Assessment/Plan Present on Admission:  . Amaurosis fugax, both eyes: No evidence of optic neuritis as ESR is normal. Negative tenderness. Patient is African-American. Suspect that she may be having issues with bradycardia and mild decreased hypoperfusion. See below.  . Hypertension: See below. Given that we are stopping her clonidine and she is a history of diastolic heart failure, ACE inhibitor would be quite appropriate for this patient. We'll start.  . Morbid obesity: Patient is criteria with BMI greater than 40  . Hypokalemia due to  loss of potassium: In discussion, patient is still taking her HCTZ, despite plan to stop that previously. Potassium given in emergency room. Rechecking and will supplement accordingly  . Bradycardia: Patient's resting heart rate in high 40s. Suspect that this is the main cause of her problems. Not on beta blocker, but is on clonidine which can cause this. We'll discontinue this and keep on monitoring  . Chronic diastolic heart failure: Echocardiogram done last year confirms grade 1 and asked [place] dysfunction. We'll check BNP  Left lower quadrant abdominal pain: Patient has history of straining. Suspect that she has diverticulosis and at times diverticulitis. No evidence of acute infection at this time. The patient information and recommend low residue high-fiber diet  Consultants: None  Code Status: Full code  Family Communication: Son at the bedside   Disposition Plan: Likely home tomorrow once heart rate improved and patient feeling  better  Time spent: 35 minutes  [facility] Pager [phone]

## 2014-10-10 DIAGNOSIS — R42 Dizziness and giddiness: Secondary | ICD-10-CM

## 2014-10-10 LAB — BASIC METABOLIC PANEL
ANION GAP: 7 (ref 5–15)
BUN: 13 mg/dL (ref 6–23)
CO2: 31 mmol/L (ref 19–32)
Calcium: 9.3 mg/dL (ref 8.4–10.5)
Chloride: 96 mmol/L (ref 96–112)
Creatinine, Ser: 1.09 mg/dL (ref 0.50–1.10)
GFR, EST AFRICAN AMERICAN: 58 mL/min — AB (ref >90)
GFR, EST NON AFRICAN AMERICAN: 50 mL/min — AB (ref >90)
Glucose, Bld: 98 mg/dL (ref 70–99)
POTASSIUM: 3.6 mmol/L (ref 3.5–5.1)
Sodium: 134 mmol/L — ABNORMAL LOW (ref 135–145)

## 2014-10-10 LAB — GLUCOSE, CAPILLARY
Glucose-Capillary: 98 mg/dL (ref 70–99)
Glucose-Capillary: 85 mg/dL (ref 70–99)

## 2014-10-10 MED ORDER — LISINOPRIL 5 MG PO TABS: 5.0000 mg | ORAL_TABLET | Freq: Every day | ORAL | Status: DC

## 2014-10-10 MED ORDER — LISINOPRIL 5 MG PO TABS
5.0000 mg | ORAL_TABLET | Freq: Every day | ORAL | Status: AC
Start: 2014-10-10 — End: ?

## 2014-10-10 NOTE — Discharge Summary (Signed)
Discharge Summary  Caroline Fernandez NHA:[id number] DOB: Jul 04, 1941  PCP: Lorelei Pont, MD  Admit date: 10/08/2014 Discharge date: 10/10/2014  Time spent: 25 minutes  Recommendations for Outpatient Follow-up:  1. Medication change: Patient's clonidine discontinued 2. New medication: Lisinopril 5 mg by mouth 3. Patient will follow up with her PCP in the next 4 weeks  Discharge Diagnoses:  Active Hospital Problems   Diagnosis Date Noted  . Amaurosis fugax, both eyes 10/09/2014  . Hypertension 10/09/2014  . Morbid obesity 10/09/2014  . Type II diabetes mellitus 10/09/2014  . Hypokalemia due to loss of potassium 10/09/2014  . Bradycardia 10/09/2014  . Chronic diastolic heart failure 83/29/1916  . Dizziness 10/09/2014    Resolved Hospital Problems   Diagnosis Date Noted Date Resolved  No resolved problems to display.    Discharge Condition: Improved, being discharged home  Diet recommendation: carb modified, low sodium  Filed Weights   10/08/14 2135 10/09/14 1557  Weight: 79.833 kg (176 lb) 80.8 kg (178 lb 2.1 oz)    History of present illness:  74 year old female with past mental history of hypertension diabetes who was admitted a little over a month ago for dizziness and at that time underwent full stroke workup. Since that time she's been started on clonidine for her high blood pressure. She will return back on 3/17 with more complaints of the same as well as complaints of blurry and cloudy vision. Initial lab work unremarkable other than hypokalemia and EKG normal, but resting heart rate noted to be around 49. BNP on admission was normal,   Hospital Course:  Active Problems:   Amaurosis fugax, both eyes: ESR unremarkable. Do not think that this is temporal arteritis.  Given full stroke workup one month ago, do not think that this is TIA. Suspect that this is likely bradycardia brought on by clonidine. By stopping clonidine, patient's heart rate improved to the mid 50s  by second day. Already feeling better.   Hypertension: See below   Morbid obesity: Patient is criteria with BMI greater than 40   Type II diabetes mellitus: CBG stable   Hypokalemia due to loss of potassium: Replaced, restarted on potassium supplement   Bradycardia: See above   Chronic diastolic heart failure: BNP normal, euvolemic. ACE inhibitor added   Dizziness: Secondary to bradycardia. Vaginal/clitoral discomfort: Patient claimed of some discomfort in her vaginal and clitoral area. This is related to her nurse. Patient requested a female physician to evaluate this as she would feel more comfortable. One of my colleagues examined the patient and found no evidence of irritation, lesion, tear or other unremarkable findings   Procedures: None  Consultations:  None  Discharge Exam: BP 122/58 mmHg  Pulse 55  Temp(Src) 98.4 F (36.9 C) (Oral)  Resp 18  Ht '5\' 3"'  (1.6 m)  Wt 80.8 kg (178 lb 2.1 oz)  BMI 31.56 kg/m2  SpO2 100%  General: Alert and oriented 3, no acute distress  Cardiovascular: Regular rhythm, mild bradycardia  Respiratory: Clear to auscultation bilaterally  Discharge Instructions You were cared for by a hospitalist during your hospital stay. If you have any questions about your discharge medications or the care you received while you were in the hospital after you are discharged, you can call the unit and asked to speak with the hospitalist on call if the hospitalist that took care of you is not available. Once you are discharged, your primary care physician will handle any further medical issues. Please note that NO REFILLS for any discharge medications  will be authorized once you are discharged, as it is imperative that you return to your primary care physician (or establish a relationship with a primary care physician if you do not have one) for your aftercare needs so that they can reassess your need for medications and monitor your lab values.  Discharge  Instructions    Diet - low sodium heart healthy    Complete by:  As directed      Increase activity slowly    Complete by:  As directed             Medication List    STOP taking these medications        cloNIDine 0.1 MG tablet  Commonly known as:  CATAPRES     esomeprazole 40 MG capsule  Commonly known as:  Masury these medications        atorvastatin 20 MG tablet  Commonly known as:  LIPITOR  Take 20 mg by mouth at bedtime.     cholecalciferol 400 UNITS Tabs tablet  Commonly known as:  VITAMIN D  Take 800 Units by mouth daily.     lisinopril 5 MG tablet  Commonly known as:  PRINIVIL,ZESTRIL  Take 1 tablet (5 mg total) by mouth daily.     metFORMIN 500 MG tablet  Commonly known as:  GLUCOPHAGE  Take 500 mg by mouth daily with breakfast.     naphazoline 0.1 % ophthalmic solution  Commonly known as:  NAPHCON  Place 1 drop into the left eye 4 (four) times daily as needed.     pantoprazole 40 MG tablet  Commonly known as:  PROTONIX  Take 40 mg by mouth daily.     potassium chloride 10 MEQ tablet  Commonly known as:  K-DUR  Take 20 mEq by mouth daily.       Allergies  Allergen Reactions  . Aspirin Other (See Comments)    "makes her nervous"  . Amoxicillin Itching and Rash       Follow-up Information    Follow up with PARUCHURI,VAMSEE, MD On 10/30/2014.   Specialty:  Internal Medicine   Why:  APPT scheduled for Thursday April 07th, 2016 @ 09:00am. Phone: [phone].       The results of significant diagnostics from this hospitalization (including imaging, microbiology, ancillary and laboratory) are listed below for reference.    Significant Diagnostic Studies: Ct Head Wo Contrast  10/09/2014   CLINICAL DATA:  Dizziness, 2 day duration.  Headaches.  EXAM: CT HEAD WITHOUT CONTRAST  TECHNIQUE: Contiguous axial images were obtained from the base of the skull through the vertex without intravenous contrast.  COMPARISON:  None.  FINDINGS: There  is no intracranial hemorrhage, mass or evidence of acute infarction. There is mild generalized atrophy. There is mild chronic microvascular ischemic change. There is no significant extra-axial fluid collection.  No acute intracranial findings are evident.  IMPRESSION: No acute intracranial findings. Mild atrophy and microvascular changes.   Electronically Signed   By: Andreas Newport M.D.   On: 10/09/2014 02:06    Microbiology: No results found for this or any previous visit (from the past 240 hour(s)).   Labs: Basic Metabolic Panel:  Recent Labs Lab 10/09/14 0115 10/09/14 1828 10/10/14 0600  NA 130* 132* 134*  K 2.8* 3.5 3.6  CL 91* 93* 96  CO2 '28 31 31  ' GLUCOSE 118* 134* 98  BUN '15 10 13  ' CREATININE 1.09 1.12* 1.09  CALCIUM 9.3 9.4  9.3   Liver Function Tests: No results for input(s): AST, ALT, ALKPHOS, BILITOT, PROT, ALBUMIN in the last 168 hours. No results for input(s): LIPASE, AMYLASE in the last 168 hours. No results for input(s): AMMONIA in the last 168 hours. CBC:  Recent Labs Lab 10/09/14 0115 10/09/14 2020  WBC 7.8 6.4  NEUTROABS 4.8  --   HGB 11.7* 11.3*  HCT 33.4* 32.9*  MCV 80.9 80.6  PLT 225 245   Cardiac Enzymes: No results for input(s): CKTOTAL, CKMB, CKMBINDEX, TROPONINI in the last 168 hours. BNP: BNP (last 3 results)  Recent Labs  10/09/14 1828  BNP 46.6    ProBNP (last 3 results) No results for input(s): PROBNP in the last 8760 hours.  CBG:  Recent Labs Lab 10/09/14 1559 10/09/14 1724 10/09/14 2318 10/10/14 0756 10/10/14 1204  GLUCAP 121* 86 96 98 85       Signed:  Ava Tangney K  [facility] 10/10/2014, 5:07 PM

## 2014-10-10 NOTE — Progress Notes (Signed)
Medicare Important Message given?  YES (If response is "NO", the following Medicare IM given date fields will be blank) Date Medicare IM given:  10/10/14 Medicare IM given by:  Anijah Spohr 

## 2014-10-10 NOTE — Progress Notes (Signed)
Patient discharge teaching given, including activity, diet, follow-up appoints, and medications. Patient verbalized understanding of all discharge instructions. IV access was d/c'd. Vitals are stable. Skin is intact except as charted in most recent assessments. Pt to be escorted out by NT, to be driven home by family. 

## 2014-10-10 NOTE — Discharge Instructions (Signed)

## 2014-10-10 NOTE — Care Management Note (Signed)
    Page 1 of 1   10/10/2014     4:42:25 PM CARE MANAGEMENT NOTE 10/10/2014  Patient:  IRIDIANA, FONNER   Account Number:  [id number]  Date Initiated:  10/10/2014  Documentation initiated by:  Tomi Bamberger  Subjective/Objective Assessment:   dx dizziness  admit- lives with daughter.     Action/Plan:   Anticipated DC Date:  10/10/2014   Anticipated DC Plan:  HOME/SELF CARE      DC Planning Services  CM consult      Choice offered to / List presented to:             Status of service:  Completed, signed off Medicare Important Message given?  NA - LOS <3 / Initial given by admissions (If response is "NO", the following Medicare IM given date fields will be blank) Date Medicare IM given:   Medicare IM given by:   Date Additional Medicare IM given:   Additional Medicare IM given by:    Discharge Disposition:  HOME/SELF CARE  Per UR Regulation:  Reviewed for med. necessity/level of care/duration of stay  If discussed at [place] of Stay Meetings, dates discussed:    Comments:  10/10/14 [place], BSN [phone] patient is for dc today, no needs.

## 2014-10-10 NOTE — Progress Notes (Signed)
UR completed 

## 2014-10-15 ENCOUNTER — Encounter (HOSPITAL_BASED_OUTPATIENT_CLINIC_OR_DEPARTMENT_OTHER): Payer: Self-pay | Admitting: Emergency Medicine

## 2014-10-15 ENCOUNTER — Emergency Department (HOSPITAL_BASED_OUTPATIENT_CLINIC_OR_DEPARTMENT_OTHER)
Admission: EM | Admit: 2014-10-15 | Discharge: 2014-10-15 | Disposition: A | Discharge status: Home / Self Care | Payer: Medicare Other | Attending: Emergency Medicine | Admitting: Emergency Medicine

## 2014-10-15 DIAGNOSIS — R011 Cardiac murmur, unspecified: Secondary | ICD-10-CM | POA: Diagnosis not present

## 2014-10-15 DIAGNOSIS — Z880 Allergy status to penicillin: Secondary | ICD-10-CM | POA: Diagnosis not present

## 2014-10-15 DIAGNOSIS — E780 Pure hypercholesterolemia: Secondary | ICD-10-CM | POA: Diagnosis not present

## 2014-10-15 DIAGNOSIS — Z888 Allergy status to other drugs, medicaments and biological substances status: Secondary | ICD-10-CM

## 2014-10-15 DIAGNOSIS — I10 Essential (primary) hypertension: Secondary | ICD-10-CM | POA: Diagnosis present

## 2014-10-15 DIAGNOSIS — T464X5A Adverse effect of angiotensin-converting-enzyme inhibitors, initial encounter: Secondary | ICD-10-CM | POA: Diagnosis not present

## 2014-10-15 DIAGNOSIS — T783XXA Angioneurotic edema, initial encounter: Secondary | ICD-10-CM | POA: Diagnosis not present

## 2014-10-15 DIAGNOSIS — K219 Gastro-esophageal reflux disease without esophagitis: Secondary | ICD-10-CM | POA: Diagnosis not present

## 2014-10-15 DIAGNOSIS — G8929 Other chronic pain: Secondary | ICD-10-CM | POA: Diagnosis not present

## 2014-10-15 DIAGNOSIS — I2510 Atherosclerotic heart disease of native coronary artery without angina pectoris: Secondary | ICD-10-CM | POA: Insufficient documentation

## 2014-10-15 DIAGNOSIS — E119 Type 2 diabetes mellitus without complications: Secondary | ICD-10-CM | POA: Insufficient documentation

## 2014-10-15 DIAGNOSIS — Z87448 Personal history of other diseases of urinary system: Secondary | ICD-10-CM | POA: Diagnosis not present

## 2014-10-15 DIAGNOSIS — Z79899 Other long term (current) drug therapy: Secondary | ICD-10-CM | POA: Diagnosis not present

## 2014-10-15 DIAGNOSIS — E785 Hyperlipidemia, unspecified: Secondary | ICD-10-CM | POA: Diagnosis not present

## 2014-10-15 DIAGNOSIS — Z862 Personal history of diseases of the blood and blood-forming organs and certain disorders involving the immune mechanism: Secondary | ICD-10-CM | POA: Insufficient documentation

## 2014-10-15 MED ORDER — AMLODIPINE BESYLATE 5 MG PO TABS
5.0000 mg | ORAL_TABLET | Freq: Once | ORAL | Status: AC
  Administered 2014-10-15: 5 mg via ORAL
  Filled 2014-10-15: qty 1

## 2014-10-15 NOTE — ED Notes (Signed)
Pt reports discharge from hospital last week, since discharge she has noted swelling under her eyes and to her bottom lips, pt states that she would soak clothes in salt water and place on face, pt recently started on lisinopril at d/c, denies dizziness, chest pain, nausea vomting

## 2014-10-15 NOTE — Discharge Instructions (Signed)
Angioedema °Angioedema is sudden puffiness (swelling), often of the skin. It can happen: °· On your face or privates (genitals). °· In your belly (abdomen) or other body parts. °It usually happens quickly and gets better in 1 or 2 days. It often starts at night and is found when you wake up. You may get red, itchy patches of skin (hives). Attacks can be dangerous if your breathing passages get puffy. °The condition may happen only once, or it can come back at random times. It may happen for several years before it goes away for good. °HOME CARE °· Only take medicines as told by your doctor. °· Always carry your emergency allergy medicines with you. °· Wear a medical bracelet as told by your doctor. °· Avoid things that you know will cause attacks (triggers). °GET HELP IF: °· You have another attack. °· Your attacks happen more often or get worse. °· The condition was passed to you by your parents and you want to have children. °GET HELP RIGHT AWAY IF:  °· Your mouth, tongue, or lips are very puffy. °· You have trouble breathing. °· You have trouble swallowing. °· You pass out (faint). °MAKE SURE YOU:  °· Understand these instructions. °· Will watch your condition. °· Will get help right away if you are not doing well or get worse. °Document Released: 06/29/2009 Document Revised: 05/01/2013 Document Reviewed: 03/04/2013 °ExitCare® Patient Information ©2015 [employer]. This information is not intended to replace advice given to you by your health care provider. Make sure you discuss any questions you have with your health care provider. ° °

## 2014-10-15 NOTE — ED Provider Notes (Signed)
CSN: [id number]     Arrival date & time 10/15/14  2128 History   This chart was scribed for Caroline Beer, MD by Evelene Croon, ED Scribe. This patient was seen in room MH06/MH06 and the patient's care was started 11:02 PM.    Chief Complaint  Patient presents with  . Hypertension    Patient is a 74 y.o. female presenting with allergic reaction. The history is provided by the patient. No language interpreter was used.  Allergic Reaction Presenting symptoms: swelling   Swelling:    Location: periorbital and reports lip but it is not swollen now.   Onset quality:  Sudden   Timing:  Constant   Progression:  Improving   Chronicity:  New Severity:  Mild Prior allergic episodes:  No prior episodes Context: medications   Context comment:  Started on lisinopril last week Relieved by:  Nothing Worsened by:  Nothing tried  HPI Comments:  Caroline Fernandez is a 74 y.o. female with a h/o hypertension who presents to the Emergency Department complaining of swelling under her eyes that started a few days ago. Pt was recently admitted for weakness and HTN and started on lisinopril which she recently started taking. Pt reports placing salt soaked clothing on her eyes with minimal relief. She denies dizziness, HA, CP, nausea and vomiting. Pt has follow up appoinmtment with PCP on 11/03/2014 bp still up on new meds  Past Medical History  Diagnosis Date  . Hypertension   . GERD (gastroesophageal reflux disease)   . High cholesterol   . Coronary artery disease   . Morbid obesity   . Heart murmur   . Type II diabetes mellitus   . Bleeds easily   . History of stomach ulcers   . Daily headache     "lately" (10/09/2014)  . Chronic lower back pain   . Renal disorder    Past Surgical History  Procedure Laterality Date  . Cataract extraction w/ intraocular lens implant Left   . Abdominal hysterectomy    . Right oophorectomy    . Left oophorectomy    . Tubal ligation    . Dilation and curettage  of uterus  "several"  . Glaucoma surgery Left ~ 07/2014   History reviewed. No pertinent family history. History  Substance Use Topics  . Smoking status: Never Smoker   . Smokeless tobacco: Former Systems developer    Types: Snuff     Comment: "quit using snuff in the 1990's"  . Alcohol Use: Yes     Comment: "I was an alcoholic; quit in the 6606'T"   OB History    No data available     Review of Systems  HENT: Positive for facial swelling.   Respiratory: Negative for shortness of breath.   Cardiovascular: Negative for chest pain.  Gastrointestinal: Negative for nausea and vomiting.  Neurological: Negative for dizziness.  All other systems reviewed and are negative.     Allergies  Aspirin and Amoxicillin  Home Medications   Prior to Admission medications   Medication Sig Start Date End Date Taking? Authorizing Provider  atorvastatin (LIPITOR) 20 MG tablet Take 20 mg by mouth at bedtime.    Yes Historical Provider, MD  cholecalciferol (VITAMIN D) 400 UNITS TABS tablet Take 800 Units by mouth daily.   Yes Historical Provider, MD  hydrochlorothiazide (HYDRODIURIL) 25 MG tablet Take 25 mg by mouth daily.   Yes Historical Provider, MD  lisinopril (PRINIVIL,ZESTRIL) 5 MG tablet Take 1 tablet (5 mg total) by mouth  daily. 10/10/14  Yes Annita Brod, MD  metFORMIN (GLUCOPHAGE) 500 MG tablet Take 500 mg by mouth daily with breakfast.   Yes Historical Provider, MD  naphazoline (NAPHCON) 0.1 % ophthalmic solution Place 1 drop into the left eye 4 (four) times daily as needed. Patient taking differently: Place 1 drop into both eyes 4 (four) times daily as needed for irritation (itching).  05/18/12  Yes Leonard Schwartz, MD  pantoprazole (PROTONIX) 40 MG tablet Take 40 mg by mouth daily.   Yes Historical Provider, MD  potassium chloride (K-DUR) 10 MEQ tablet Take 20 mEq by mouth daily.    Yes Historical Provider, MD   BP 210/98 mmHg  Pulse 86  Temp(Src) 99 F (37.2 C) (Oral)  Resp 18  Ht 5\' 3"   (1.6 m)  Wt 176 lb (79.833 kg)  BMI 31.18 kg/m2  SpO2 100% Physical Exam  Constitutional: She is oriented to person, place, and time. She appears well-developed and well-nourished. No distress.  HENT:  Head: Normocephalic and atraumatic.  Mouth/Throat: Oropharynx is clear and moist.  No swelling of lips tongue or uvula  Eyes: Conjunctivae are normal. Pupils are equal, round, and reactive to light.  Very mild periorbital swelling  Neck: Normal range of motion. Neck supple.  Cardiovascular: Normal rate, regular rhythm, normal heart sounds and intact distal pulses.   Pulmonary/Chest: Effort normal and breath sounds normal. No respiratory distress. She has no wheezes. She has no rales.  Abdominal: Soft. Bowel sounds are normal. She exhibits no distension. There is no tenderness. There is no rebound.  Neurological: She is alert and oriented to person, place, and time.  Skin: Skin is warm and dry.  Psychiatric: She has a normal mood and affect. Her behavior is normal.  Nursing note and vitals reviewed.   ED Course  Procedures  DIAGNOSTIC STUDIES:  Oxygen Saturation is 100% on RA, normal by my interpretation.    COORDINATION OF CARE:  11:11 PM Advised pt to discontinue application of salt soaked rag and lisinopril. Discussed treatment plan with pt at bedside and pt agreed to plan.   Labs Review Labs Reviewed - No data to display  Imaging Review No results found.   EKG Interpretation None      MDM   Final diagnoses:  None    Mild ace inhibitor angioedema.  Stop lisinopril call your doctor in am to be seen and to discuss new BP medications.  No salt in diet.    I personally performed the services described in this documentation, which was scribed in my presence. The recorded information has been reviewed and is accurate.     Veatrice Kells, MD 10/16/14 [phone]

## 2014-10-16 ENCOUNTER — Encounter (HOSPITAL_BASED_OUTPATIENT_CLINIC_OR_DEPARTMENT_OTHER): Payer: Self-pay | Admitting: Emergency Medicine

## 2015-09-07 IMAGING — CR DG CHEST 2V
2 series · 2 of 2 positions shown · non-contrast
Comparison: April 01, 2014.

CLINICAL DATA: Weakness, chills.

EXAM:
CHEST  2 VIEW

[w chest pa]
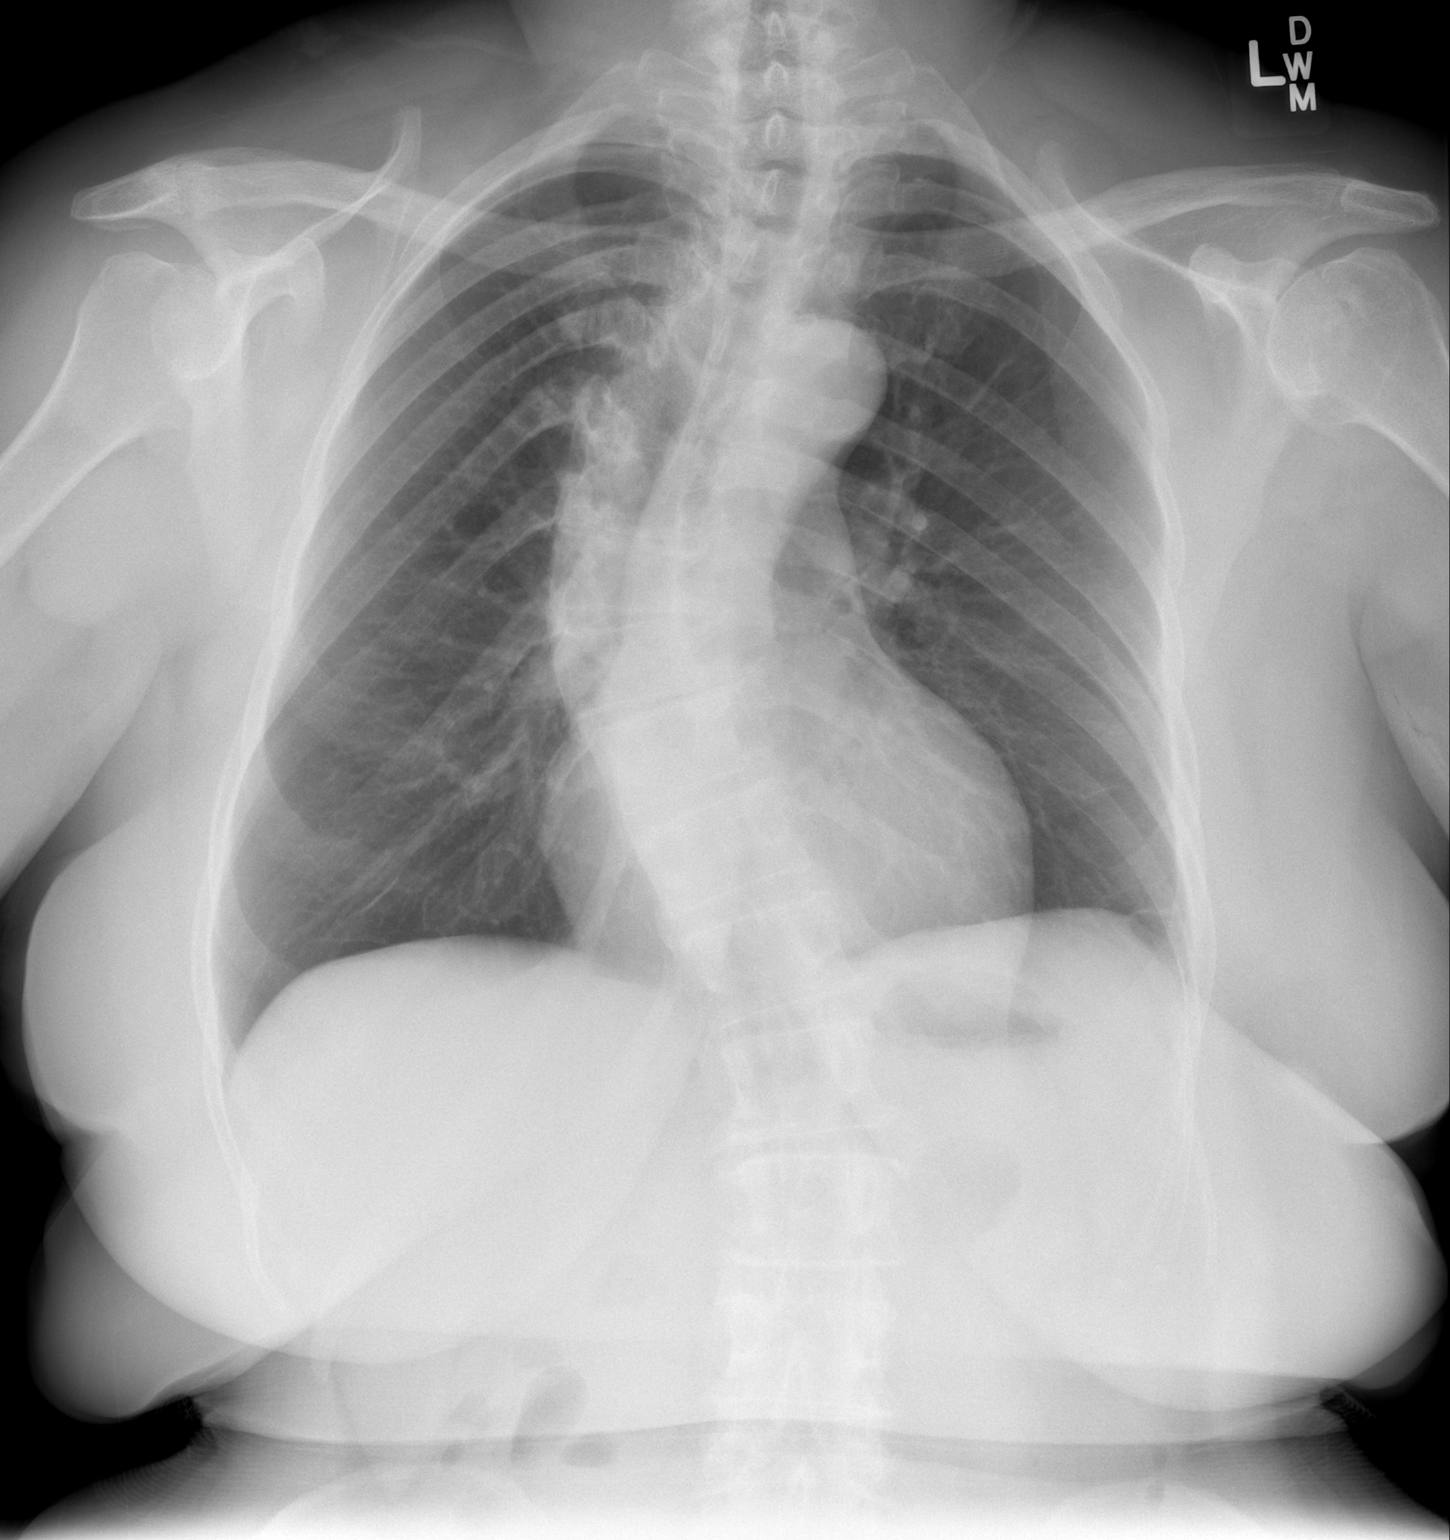

[w chest lat]
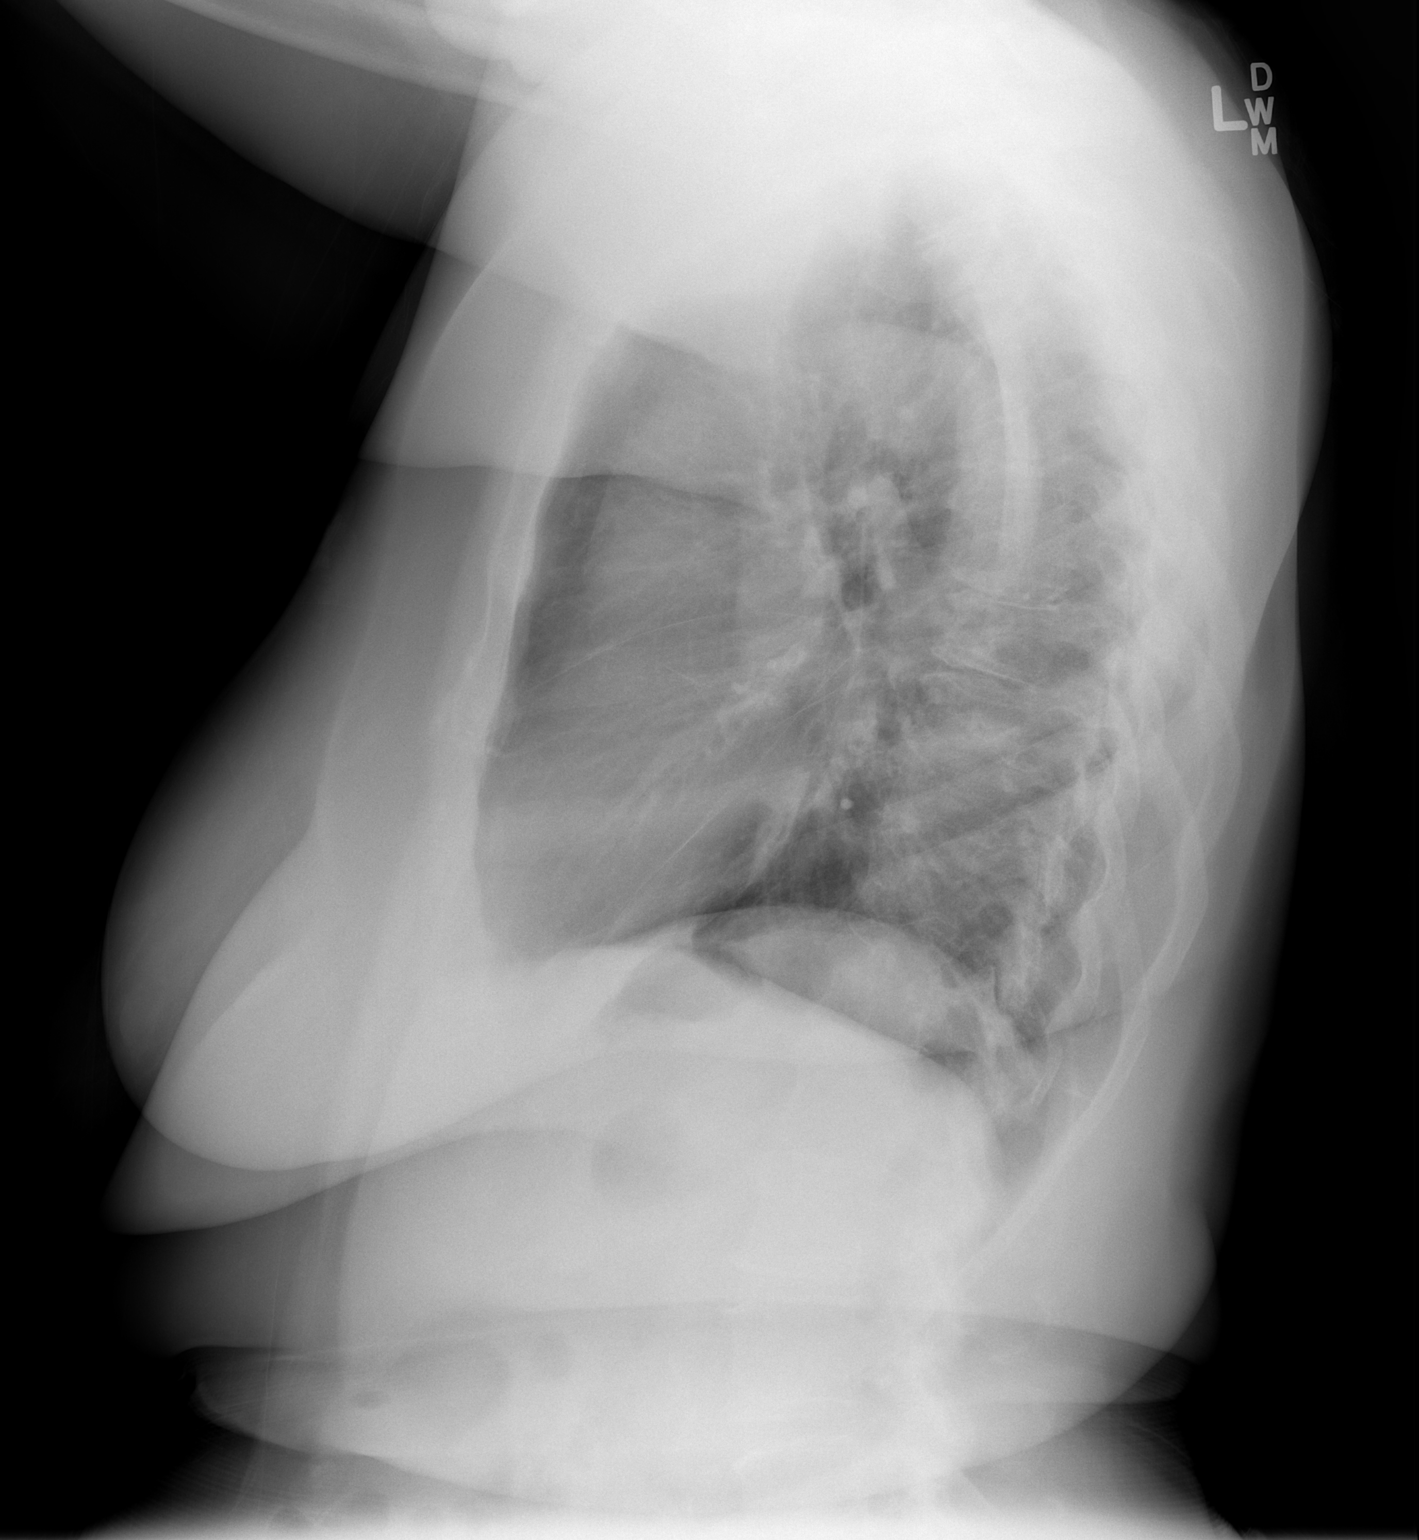

[2 of 2 positions shown; findings below may reference images not displayed]

FINDINGS: The heart size and mediastinal contours are within normal limits.
Both lungs are clear. No pneumothorax or pleural effusion is noted.
Severe dextroscoliosis of mid thoracic spine is noted.
IMPRESSION: No acute cardiopulmonary abnormality seen.

## 2015-09-08 IMAGING — US US ABDOMEN COMPLETE
1 series · 14 of 25 positions shown · non-contrast
Comparison: CT 10/15/2012

CLINICAL DATA: Pain aggravated by eating and drinking. Symptoms for
few weeks.

EXAM:
ULTRASOUND ABDOMEN COMPLETE

[Series 1: us abdomen complete · 0.15mm/px · 14 of 82 slices shown]
[im 1/82]
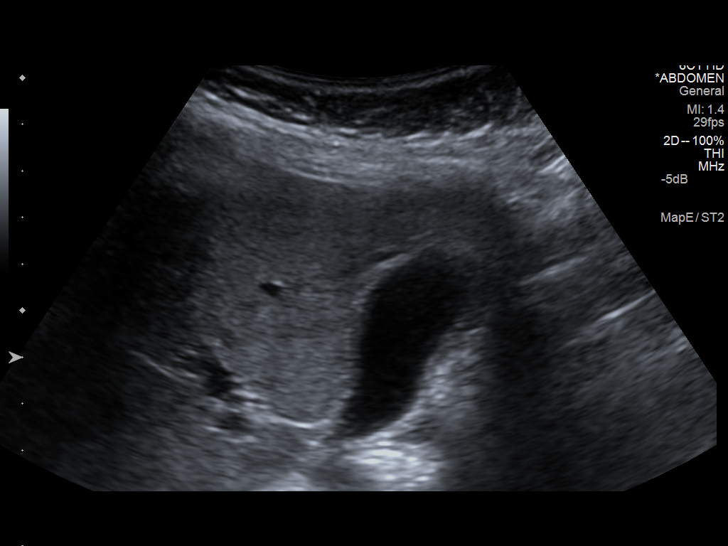
[im 7/82]
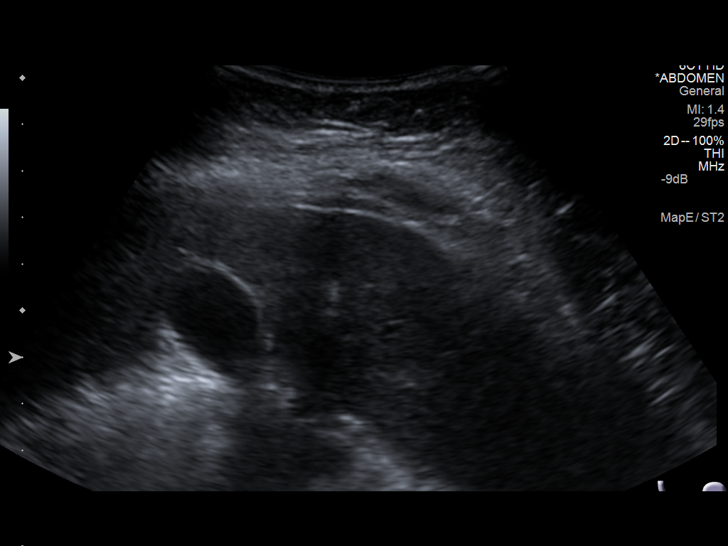
[im 14/82]
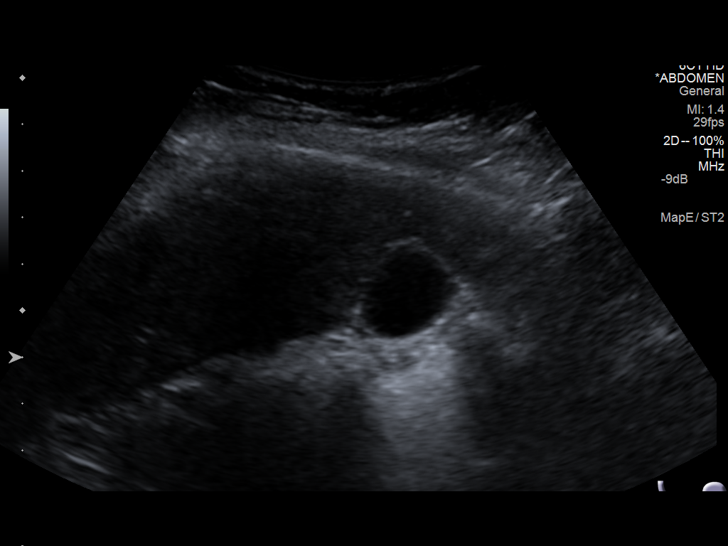
[im 21/82]
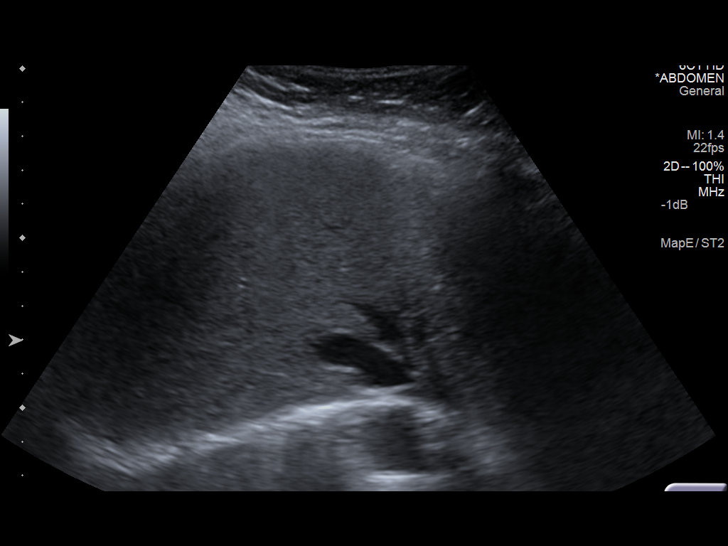
[im 28/82]
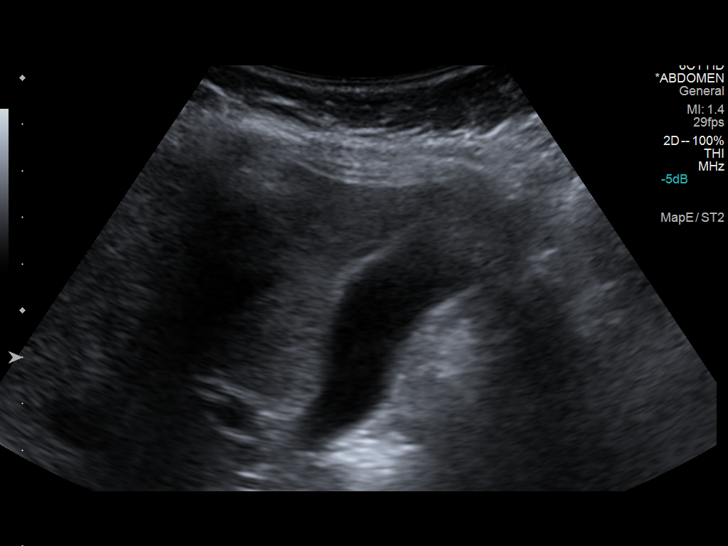
[im 31/82]
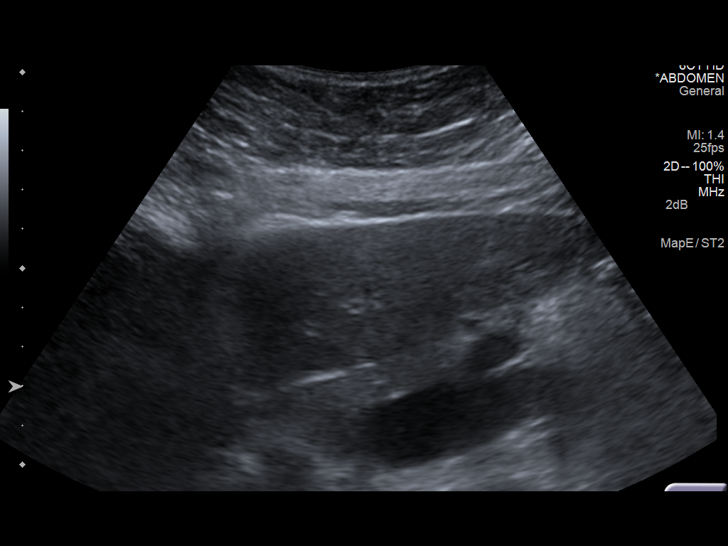
[im 38/82]
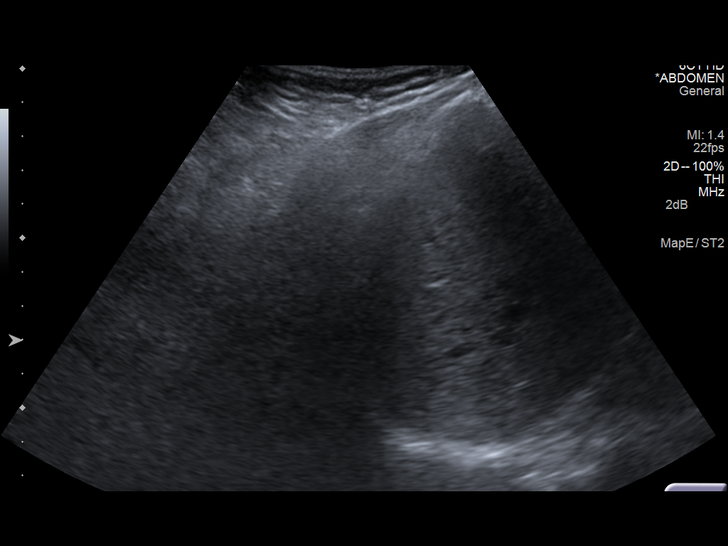
[im 44/82]
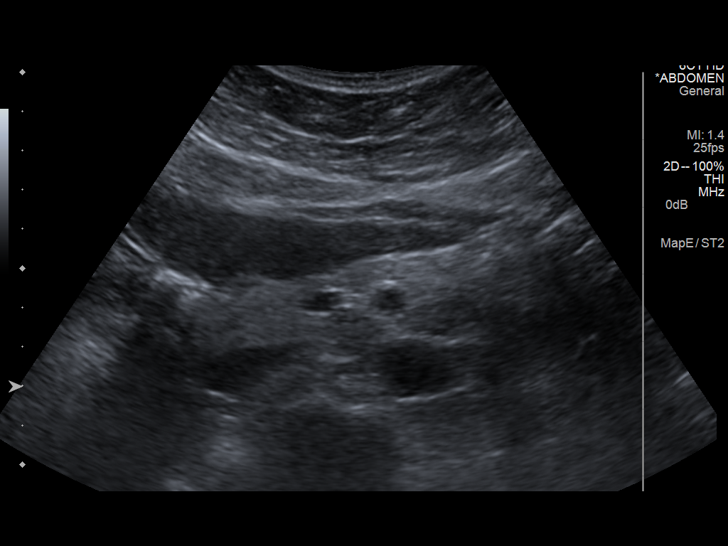
[im 51/82]
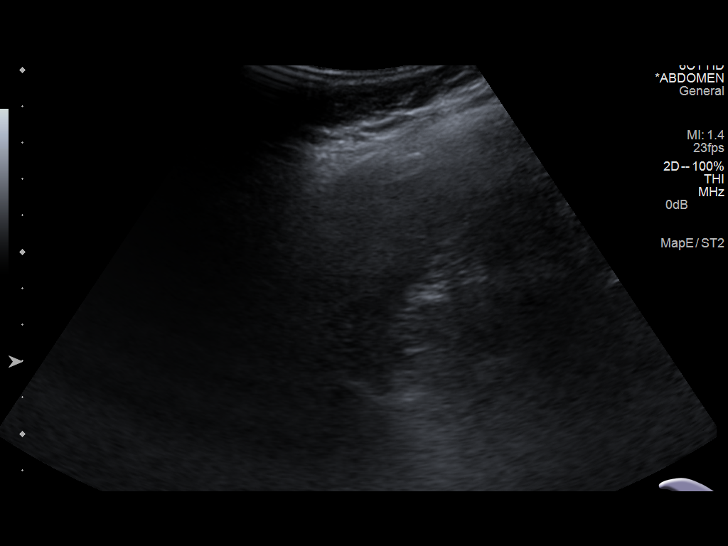
[im 55/82]
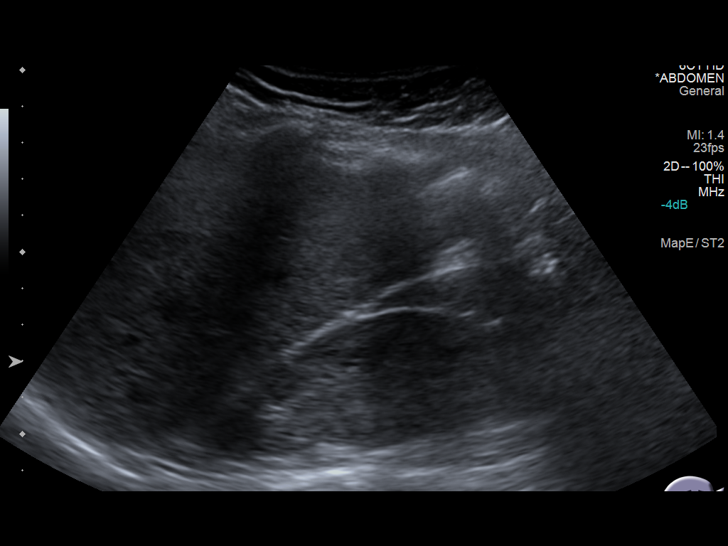
[im 61/82]
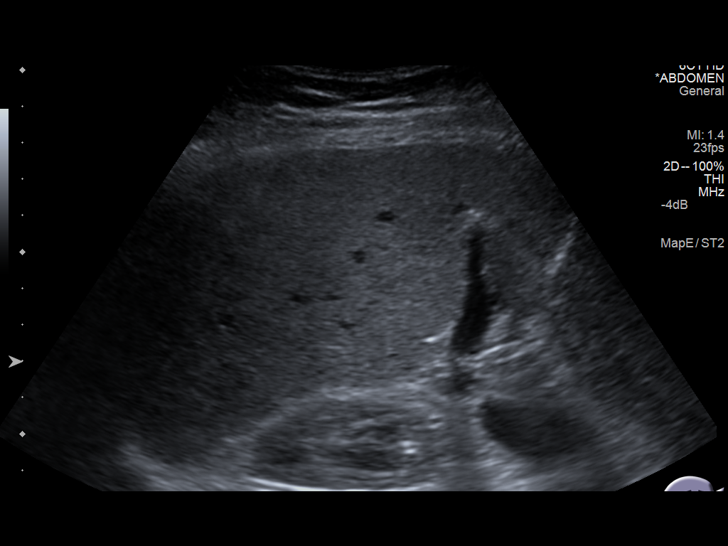
[im 68/82]
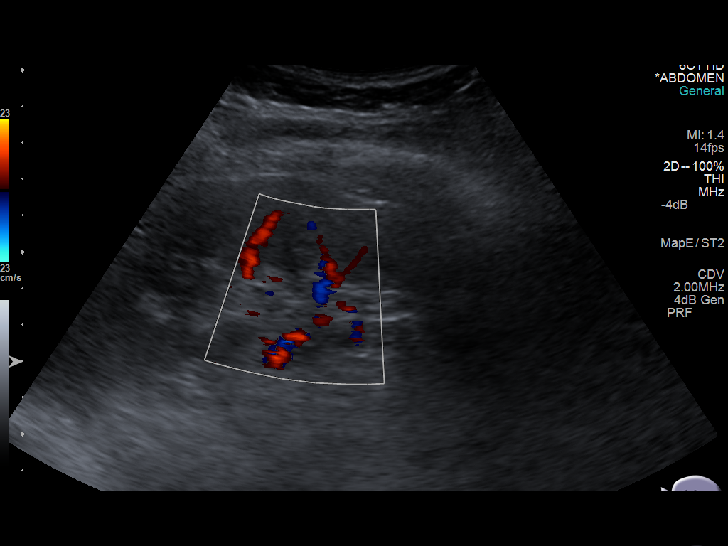
[im 75/82]
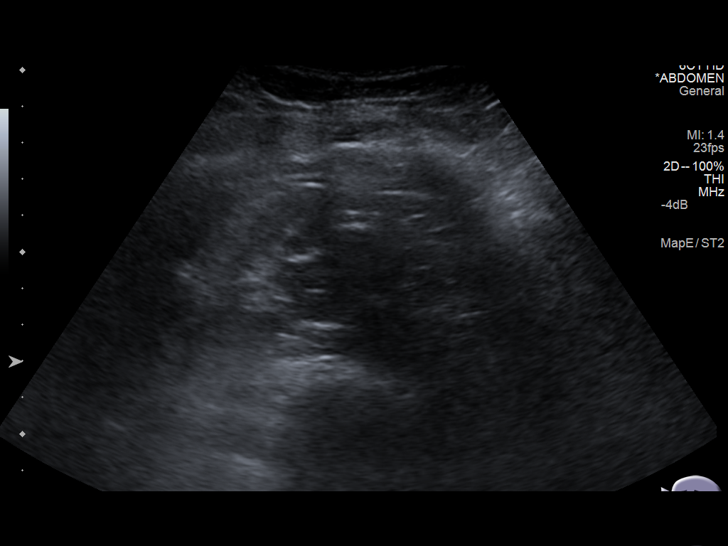
[im 82/82]
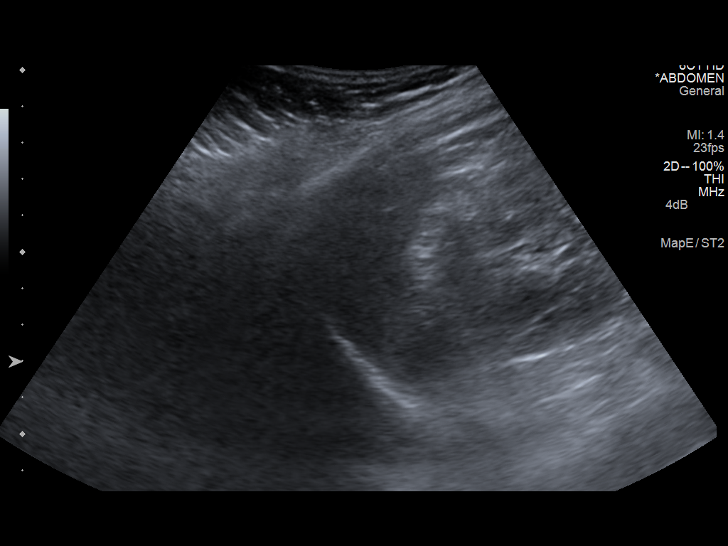

[14 of 25 positions shown; findings below may reference images not displayed]

FINDINGS: Gallbladder: No gallstones or wall thickening visualized. No
sonographic Murphy sign noted.

Common bile duct: Diameter: 0.3 cm

Liver: No focal lesion identified. Within normal limits in
parenchymal echogenicity.

IVC: No abnormality visualized.

Pancreas: Visualized portion unremarkable.

Spleen: Size and appearance within normal limits.

Right Kidney: Length: 10.1 cm. Echogenicity within normal limits. No
mass or hydronephrosis visualized.

Left Kidney: Length: 10.9 cm. Echogenicity within normal limits. No
mass or hydronephrosis visualized. Limited evaluation of the left
kidney due to bowel gas.

Abdominal aorta: No aneurysm visualized.

Other findings: None.
IMPRESSION: Negative abdominal ultrasound.

## 2016-08-26 DIAGNOSIS — H43811 Vitreous degeneration, right eye: Secondary | ICD-10-CM | POA: Diagnosis not present

## 2016-08-29 DIAGNOSIS — Z0000 Encounter for general adult medical examination without abnormal findings: Secondary | ICD-10-CM | POA: Diagnosis not present

## 2016-08-29 DIAGNOSIS — E041 Nontoxic single thyroid nodule: Secondary | ICD-10-CM | POA: Diagnosis not present

## 2016-08-29 DIAGNOSIS — Z760 Encounter for issue of repeat prescription: Secondary | ICD-10-CM | POA: Diagnosis not present

## 2016-08-29 DIAGNOSIS — E782 Mixed hyperlipidemia: Secondary | ICD-10-CM | POA: Diagnosis not present

## 2016-08-29 DIAGNOSIS — E1165 Type 2 diabetes mellitus with hyperglycemia: Secondary | ICD-10-CM | POA: Diagnosis not present

## 2016-09-23 DIAGNOSIS — H43811 Vitreous degeneration, right eye: Secondary | ICD-10-CM | POA: Diagnosis not present

## 2016-09-23 DIAGNOSIS — Z961 Presence of intraocular lens: Secondary | ICD-10-CM | POA: Diagnosis not present

## 2016-10-13 DIAGNOSIS — R1084 Generalized abdominal pain: Secondary | ICD-10-CM | POA: Diagnosis not present

## 2016-10-28 DIAGNOSIS — R1084 Generalized abdominal pain: Secondary | ICD-10-CM | POA: Diagnosis not present

## 2017-01-06 DIAGNOSIS — E559 Vitamin D deficiency, unspecified: Secondary | ICD-10-CM | POA: Diagnosis not present

## 2017-01-06 DIAGNOSIS — M545 Low back pain: Secondary | ICD-10-CM | POA: Diagnosis not present

## 2017-01-06 DIAGNOSIS — K219 Gastro-esophageal reflux disease without esophagitis: Secondary | ICD-10-CM | POA: Diagnosis not present

## 2017-01-06 DIAGNOSIS — Z01118 Encounter for examination of ears and hearing with other abnormal findings: Secondary | ICD-10-CM | POA: Diagnosis not present

## 2017-01-06 DIAGNOSIS — E785 Hyperlipidemia, unspecified: Secondary | ICD-10-CM | POA: Diagnosis not present

## 2017-01-06 DIAGNOSIS — Z87891 Personal history of nicotine dependence: Secondary | ICD-10-CM | POA: Diagnosis not present

## 2017-01-06 DIAGNOSIS — E1165 Type 2 diabetes mellitus with hyperglycemia: Secondary | ICD-10-CM | POA: Diagnosis not present

## 2017-01-06 DIAGNOSIS — I10 Essential (primary) hypertension: Secondary | ICD-10-CM | POA: Diagnosis not present

## 2017-02-03 DIAGNOSIS — M545 Low back pain: Secondary | ICD-10-CM | POA: Diagnosis not present

## 2017-02-03 DIAGNOSIS — I10 Essential (primary) hypertension: Secondary | ICD-10-CM | POA: Diagnosis not present

## 2017-02-03 DIAGNOSIS — E1165 Type 2 diabetes mellitus with hyperglycemia: Secondary | ICD-10-CM | POA: Diagnosis not present

## 2017-02-03 DIAGNOSIS — E785 Hyperlipidemia, unspecified: Secondary | ICD-10-CM | POA: Diagnosis not present

## 2017-02-03 DIAGNOSIS — Z87891 Personal history of nicotine dependence: Secondary | ICD-10-CM | POA: Diagnosis not present

## 2017-02-03 DIAGNOSIS — K219 Gastro-esophageal reflux disease without esophagitis: Secondary | ICD-10-CM | POA: Diagnosis not present

## 2017-02-03 DIAGNOSIS — E559 Vitamin D deficiency, unspecified: Secondary | ICD-10-CM | POA: Diagnosis not present

## 2017-02-06 DIAGNOSIS — G4733 Obstructive sleep apnea (adult) (pediatric): Secondary | ICD-10-CM | POA: Diagnosis not present

## 2017-02-24 DIAGNOSIS — R94120 Abnormal auditory function study: Secondary | ICD-10-CM | POA: Diagnosis not present

## 2017-03-03 DIAGNOSIS — I10 Essential (primary) hypertension: Secondary | ICD-10-CM | POA: Diagnosis not present

## 2017-03-03 DIAGNOSIS — R9431 Abnormal electrocardiogram [ECG] [EKG]: Secondary | ICD-10-CM | POA: Diagnosis not present

## 2017-03-08 DIAGNOSIS — M8589 Other specified disorders of bone density and structure, multiple sites: Secondary | ICD-10-CM | POA: Diagnosis not present

## 2017-03-08 DIAGNOSIS — Z1231 Encounter for screening mammogram for malignant neoplasm of breast: Secondary | ICD-10-CM | POA: Diagnosis not present

## 2017-03-08 DIAGNOSIS — N6312 Unspecified lump in the right breast, upper inner quadrant: Secondary | ICD-10-CM | POA: Diagnosis not present

## 2017-03-13 DIAGNOSIS — E1165 Type 2 diabetes mellitus with hyperglycemia: Secondary | ICD-10-CM | POA: Diagnosis not present

## 2017-03-13 DIAGNOSIS — E785 Hyperlipidemia, unspecified: Secondary | ICD-10-CM | POA: Diagnosis not present

## 2017-03-13 DIAGNOSIS — Z6830 Body mass index (BMI) 30.0-30.9, adult: Secondary | ICD-10-CM | POA: Diagnosis not present

## 2017-03-13 DIAGNOSIS — E6609 Other obesity due to excess calories: Secondary | ICD-10-CM | POA: Diagnosis not present

## 2017-03-13 DIAGNOSIS — I10 Essential (primary) hypertension: Secondary | ICD-10-CM | POA: Diagnosis not present

## 2017-04-05 DIAGNOSIS — Z6830 Body mass index (BMI) 30.0-30.9, adult: Secondary | ICD-10-CM | POA: Diagnosis not present

## 2017-04-05 DIAGNOSIS — E1165 Type 2 diabetes mellitus with hyperglycemia: Secondary | ICD-10-CM | POA: Diagnosis not present

## 2017-04-05 DIAGNOSIS — E6609 Other obesity due to excess calories: Secondary | ICD-10-CM | POA: Diagnosis not present

## 2017-04-18 DIAGNOSIS — R109 Unspecified abdominal pain: Secondary | ICD-10-CM | POA: Diagnosis not present

## 2017-04-18 DIAGNOSIS — K625 Hemorrhage of anus and rectum: Secondary | ICD-10-CM | POA: Diagnosis not present

## 2017-04-18 DIAGNOSIS — R194 Change in bowel habit: Secondary | ICD-10-CM | POA: Diagnosis not present

## 2017-04-20 DIAGNOSIS — R1012 Left upper quadrant pain: Secondary | ICD-10-CM | POA: Diagnosis not present

## 2017-04-20 DIAGNOSIS — E1165 Type 2 diabetes mellitus with hyperglycemia: Secondary | ICD-10-CM | POA: Diagnosis not present

## 2017-04-20 DIAGNOSIS — E559 Vitamin D deficiency, unspecified: Secondary | ICD-10-CM | POA: Diagnosis not present

## 2017-04-20 DIAGNOSIS — M545 Low back pain: Secondary | ICD-10-CM | POA: Diagnosis not present

## 2017-04-20 DIAGNOSIS — Z87891 Personal history of nicotine dependence: Secondary | ICD-10-CM | POA: Diagnosis not present

## 2017-04-20 DIAGNOSIS — E785 Hyperlipidemia, unspecified: Secondary | ICD-10-CM | POA: Diagnosis not present

## 2017-04-20 DIAGNOSIS — K219 Gastro-esophageal reflux disease without esophagitis: Secondary | ICD-10-CM | POA: Diagnosis not present

## 2017-04-20 DIAGNOSIS — N390 Urinary tract infection, site not specified: Secondary | ICD-10-CM | POA: Diagnosis not present

## 2017-04-20 DIAGNOSIS — I10 Essential (primary) hypertension: Secondary | ICD-10-CM | POA: Diagnosis not present

## 2017-04-27 DIAGNOSIS — R1012 Left upper quadrant pain: Secondary | ICD-10-CM | POA: Diagnosis not present

## 2017-04-27 DIAGNOSIS — K760 Fatty (change of) liver, not elsewhere classified: Secondary | ICD-10-CM | POA: Diagnosis not present

## 2017-05-01 DIAGNOSIS — K219 Gastro-esophageal reflux disease without esophagitis: Secondary | ICD-10-CM | POA: Diagnosis not present

## 2017-05-01 DIAGNOSIS — M545 Low back pain: Secondary | ICD-10-CM | POA: Diagnosis not present

## 2017-05-01 DIAGNOSIS — E559 Vitamin D deficiency, unspecified: Secondary | ICD-10-CM | POA: Diagnosis not present

## 2017-05-01 DIAGNOSIS — E785 Hyperlipidemia, unspecified: Secondary | ICD-10-CM | POA: Diagnosis not present

## 2017-05-01 DIAGNOSIS — E1165 Type 2 diabetes mellitus with hyperglycemia: Secondary | ICD-10-CM | POA: Diagnosis not present

## 2017-05-01 DIAGNOSIS — Z87891 Personal history of nicotine dependence: Secondary | ICD-10-CM | POA: Diagnosis not present

## 2017-05-01 DIAGNOSIS — I10 Essential (primary) hypertension: Secondary | ICD-10-CM | POA: Diagnosis not present

## 2017-05-29 DIAGNOSIS — I10 Essential (primary) hypertension: Secondary | ICD-10-CM | POA: Diagnosis not present

## 2017-05-29 DIAGNOSIS — K219 Gastro-esophageal reflux disease without esophagitis: Secondary | ICD-10-CM | POA: Diagnosis not present

## 2017-05-29 DIAGNOSIS — M545 Low back pain: Secondary | ICD-10-CM | POA: Diagnosis not present

## 2017-05-29 DIAGNOSIS — Z2821 Immunization not carried out because of patient refusal: Secondary | ICD-10-CM | POA: Diagnosis not present

## 2017-05-29 DIAGNOSIS — E785 Hyperlipidemia, unspecified: Secondary | ICD-10-CM | POA: Diagnosis not present

## 2017-05-29 DIAGNOSIS — E559 Vitamin D deficiency, unspecified: Secondary | ICD-10-CM | POA: Diagnosis not present

## 2017-05-29 DIAGNOSIS — E1165 Type 2 diabetes mellitus with hyperglycemia: Secondary | ICD-10-CM | POA: Diagnosis not present

## 2017-05-29 DIAGNOSIS — Z87891 Personal history of nicotine dependence: Secondary | ICD-10-CM | POA: Diagnosis not present

## 2017-06-09 DIAGNOSIS — I10 Essential (primary) hypertension: Secondary | ICD-10-CM | POA: Diagnosis not present

## 2017-06-09 DIAGNOSIS — R9431 Abnormal electrocardiogram [ECG] [EKG]: Secondary | ICD-10-CM | POA: Diagnosis not present

## 2017-07-26 ENCOUNTER — Emergency Department (HOSPITAL_BASED_OUTPATIENT_CLINIC_OR_DEPARTMENT_OTHER)
Admission: EM | Admit: 2017-07-26 | Discharge: 2017-07-26 | Disposition: A | Discharge status: Home / Self Care | Payer: Medicare HMO | Attending: Emergency Medicine | Admitting: Emergency Medicine

## 2017-07-26 ENCOUNTER — Encounter (HOSPITAL_BASED_OUTPATIENT_CLINIC_OR_DEPARTMENT_OTHER): Payer: Self-pay | Admitting: *Deleted

## 2017-07-26 ENCOUNTER — Other Ambulatory Visit: Payer: Self-pay

## 2017-07-26 ENCOUNTER — Emergency Department (HOSPITAL_BASED_OUTPATIENT_CLINIC_OR_DEPARTMENT_OTHER): Payer: Medicare HMO

## 2017-07-26 DIAGNOSIS — E119 Type 2 diabetes mellitus without complications: Secondary | ICD-10-CM | POA: Diagnosis not present

## 2017-07-26 DIAGNOSIS — I110 Hypertensive heart disease with heart failure: Secondary | ICD-10-CM | POA: Insufficient documentation

## 2017-07-26 DIAGNOSIS — I5032 Chronic diastolic (congestive) heart failure: Secondary | ICD-10-CM | POA: Diagnosis not present

## 2017-07-26 DIAGNOSIS — Z79899 Other long term (current) drug therapy: Secondary | ICD-10-CM | POA: Diagnosis not present

## 2017-07-26 DIAGNOSIS — Z7984 Long term (current) use of oral hypoglycemic drugs: Secondary | ICD-10-CM | POA: Insufficient documentation

## 2017-07-26 DIAGNOSIS — M546 Pain in thoracic spine: Secondary | ICD-10-CM | POA: Insufficient documentation

## 2017-07-26 DIAGNOSIS — I2510 Atherosclerotic heart disease of native coronary artery without angina pectoris: Secondary | ICD-10-CM | POA: Diagnosis not present

## 2017-07-26 DIAGNOSIS — J9811 Atelectasis: Secondary | ICD-10-CM | POA: Diagnosis not present

## 2017-07-26 DIAGNOSIS — M549 Dorsalgia, unspecified: Secondary | ICD-10-CM

## 2017-07-26 MED ORDER — LORAZEPAM 1 MG PO TABS
0.5000 mg | ORAL_TABLET | Freq: Once | ORAL | Status: AC
  Administered 2017-07-26: 0.5 mg via ORAL
  Filled 2017-07-26: qty 1

## 2017-07-26 MED ORDER — HYDROCODONE-ACETAMINOPHEN 5-325 MG PO TABS: 1.0000 | ORAL_TABLET | Freq: Four times a day (QID) | ORAL | 0 refills | Status: AC | PRN

## 2017-07-26 MED ORDER — PREDNISONE 20 MG PO TABS: 20.0000 mg | ORAL_TABLET | Freq: Every day | ORAL | 0 refills | Status: AC

## 2017-07-26 MED ORDER — IBUPROFEN 200 MG PO TABS: 200.0000 mg | ORAL_TABLET | Freq: Three times a day (TID) | ORAL | 0 refills | Status: AC | PRN

## 2017-07-26 MED ORDER — KETOROLAC TROMETHAMINE 15 MG/ML IJ SOLN
15.0000 mg | Freq: Once | INTRAMUSCULAR | Status: AC
  Administered 2017-07-26: 15 mg via INTRAMUSCULAR
  Filled 2017-07-26: qty 1

## 2017-07-26 MED ORDER — HYDROMORPHONE HCL 1 MG/ML IJ SOLN
0.7500 mg | Freq: Once | INTRAMUSCULAR | Status: AC
  Administered 2017-07-26: 0.75 mg via INTRAMUSCULAR
  Filled 2017-07-26: qty 1

## 2017-07-26 MED FILL — predniSONE 20 MG TABS: 20 | 5 days supply | Qty: 5 | Fill #0

## 2017-07-26 MED FILL — HYDROCODON-APAP 5-325: 5-325 | 2 days supply | Qty: 10 | Fill #0

## 2017-07-26 MED FILL — IBUPROFEN 200 MG TABLET: 200 | 16 days supply | Qty: 100 | Fill #0

## 2017-07-26 NOTE — ED Triage Notes (Signed)
Pt to room 2 in w/c, able to stand and take steps to stretcher, reports left upper back/scapular pain x 3 days, denies injury or trauma, tender to palp, no pain at rest, increases to 10/10 with repositioning. ekg performed during triage, shown to edp by Al, EMT. Pt denies sob, when I point out that her resp are a little fast pt states "that's because I hurt so bad when I move." resp decrease to 18 bpm when pt is resting.

## 2017-07-26 NOTE — ED Provider Notes (Signed)
[facility] EMERGENCY DEPARTMENT Provider Note   CSN: [id number] Arrival date & time: 07/26/17  0735     History   Chief Complaint Chief Complaint  Patient presents with  . Back Pain    HPI Caroline Fernandez is a 77 y.o. female.  HPI   77 year old female with pain in her left thoracic back near her left scapula that radiates into the left axillary region.  Onset about 2 weeks ago.  Sniffily worsening in the past 2 days.  She has minimal symptoms when completely still.  She has significant pain with movement and even breathing.  She does not necessarily feel short of breath though.  No numbness or tingling.  She denies any acute trauma or strain.  No fevers.  No rash that she has noted.  She has tried taking Tylenol which initially seemed to help but has not been very effective in the past few days.  Past Medical History:  Diagnosis Date  . Bleeds easily ([place])   . Chronic lower back pain   . Coronary artery disease   . Daily headache    "lately" (10/09/2014)  . GERD (gastroesophageal reflux disease)   . Heart murmur   . High cholesterol   . History of stomach ulcers   . Hypertension   . Morbid obesity ([place])   . Renal disorder   . Type II diabetes mellitus [facility])     Patient Active Problem List   Diagnosis Date Noted  . Amaurosis fugax, both eyes 10/09/2014  . Hypertension 10/09/2014  . Morbid obesity ([place]) 10/09/2014  . Type II diabetes mellitus ([place]) 10/09/2014  . Hypokalemia due to loss of potassium 10/09/2014  . Bradycardia 10/09/2014  . Chronic diastolic heart failure ([place]) 10/09/2014  . Dizziness 10/09/2014  . Near syncope 08/24/2014  . Abdominal pain, epigastric 08/24/2014  . Hyponatremia 08/24/2014  . Hypokalemia 08/24/2014  . GERD (gastroesophageal reflux disease) 08/24/2014  . HTN (hypertension) 08/24/2014  . HLD (hyperlipidemia) 08/24/2014  . Diabetes ([place]) 08/24/2014    Past Surgical History:  Procedure Laterality Date  . ABDOMINAL  HYSTERECTOMY    . CATARACT EXTRACTION W/ INTRAOCULAR LENS IMPLANT Left   . DILATION AND CURETTAGE OF UTERUS  "several"  . GLAUCOMA SURGERY Left ~ 07/2014  . LEFT OOPHORECTOMY    . RIGHT OOPHORECTOMY    . TUBAL LIGATION      OB History    No data available       Home Medications    Prior to Admission medications   Medication Sig Start Date End Date Taking? Authorizing Provider  amLODipine (NORVASC) 5 MG tablet Take 5 mg by mouth daily.   Yes [provider]  sucralfate (CARAFATE) 1 g tablet Take 1 g by mouth 4 (four) times daily -  with meals and at bedtime.   Yes [provider]  atorvastatin (LIPITOR) 20 MG tablet Take 20 mg by mouth at bedtime.     [provider]  cholecalciferol (VITAMIN D) 400 UNITS TABS tablet Take 800 Units by mouth daily.    [provider]  hydrochlorothiazide (HYDRODIURIL) 25 MG tablet Take 25 mg by mouth daily.    [provider]  lisinopril (PRINIVIL,ZESTRIL) 5 MG tablet Take 1 tablet (5 mg total) by mouth daily. 10/10/14   Annita Brod, MD  metFORMIN (GLUCOPHAGE) 500 MG tablet Take 500 mg by mouth daily with breakfast.    [provider]  naphazoline (NAPHCON) 0.1 % ophthalmic solution Place 1 drop into the  left eye 4 (four) times daily as needed. Patient taking differently: Place 1 drop into both eyes 4 (four) times daily as needed for irritation (itching).  05/18/12   Leonard Schwartz, MD  pantoprazole (PROTONIX) 40 MG tablet Take 40 mg by mouth daily.    [provider]  potassium chloride (K-DUR) 10 MEQ tablet Take 20 mEq by mouth daily.     [provider]    Family History History reviewed. No pertinent family history.  Social History Social History   Tobacco Use  . Smoking status: Never Smoker  . Smokeless tobacco: Former Systems developer    Types: Snuff  . Tobacco comment: "quit using snuff in the 1990's"  Substance Use Topics  . Alcohol use: Yes    Comment: "I was an  alcoholic; quit in the 7628'B"  . Drug use: No     Allergies   Aspirin; Lisinopril; and Amoxicillin   Review of Systems Review of Systems  All systems reviewed and negative, other than as noted in HPI.  Physical Exam Updated Vital Signs BP 127/62 (BP Location: Right Arm)   Pulse 62   Temp 98.2 F (36.8 C) (Oral)   Resp 18   Ht 5\' 4"  (1.626 m)   Wt 72.6 kg (160 lb)   SpO2 100%   BMI 27.46 kg/m   Physical Exam  Constitutional: She appears well-developed and well-nourished. No distress.  HENT:  Head: Normocephalic and atraumatic.  Eyes: Conjunctivae are normal. Right eye exhibits no discharge. Left eye exhibits no discharge.  Neck: Neck supple.  Cardiovascular: Normal rate, regular rhythm and normal heart sounds. Exam reveals no gallop and no friction rub.  No murmur heard. Pulmonary/Chest: Effort normal and breath sounds normal. No respiratory distress.  Abdominal: Soft. She exhibits no distension. There is no tenderness.  Musculoskeletal:  Tenderness to palpation in the left scapular region extending towards the axilla.  There are no overlying concerning skin changes.  She reports increased pain with range of motion of the left shoulder.  She can actively range it though.  Neurovascularly intact distally into the left upper extremity.  Neurological: She is alert.  Skin: Skin is warm and dry.  Psychiatric: She has a normal mood and affect. Her behavior is normal. Thought content normal.  Nursing note and vitals reviewed.    ED Treatments / Results  Labs (all labs ordered are listed, but only abnormal results are displayed) Labs Reviewed - No data to display  EKG  EKG Interpretation  Date/Time:  Wednesday July 26 2017 07:47:31 EST Ventricular Rate:  75 PR Interval:    QRS Duration: 73 QT Interval:  474 QTC Calculation: 530 R Axis:   35 Text Interpretation:  Sinus rhythm Left atrial enlargement Anterior infarct, old Borderline repol abnrm, inferolateral  leads Prolonged QT interval Confirmed by Virgel Manifold [phone]) on 07/26/2017 8:16:56 AM       Radiology Dg Chest 2 View  Result Date: 07/26/2017 CLINICAL DATA:  Left upper back and scapular pain for the past 3 days. No injury. EXAM: CHEST  2 VIEW COMPARISON:  Chest x-ray dated August 24, 2014. FINDINGS: The cardiomediastinal silhouette is normal in size. Normal pulmonary vascularity. Linear atelectasis at the periphery of both lung bases. No focal consolidation, pleural effusion, or pneumothorax. No acute osseous abnormality. Unchanged severe dextroscoliosis of the midthoracic spine. IMPRESSION: No active cardiopulmonary disease. Electronically Signed   By: Titus Dubin M.D.   On: 07/26/2017 10:00    Procedures Procedures (including critical care time)  Medications Ordered in ED Medications  ketorolac (TORADOL) 15 MG/ML injection 15 mg (15 mg Intramuscular Given 07/26/17 1000)  HYDROmorphone (DILAUDID) injection 0.75 mg (0.75 mg Intramuscular Given 07/26/17 1000)  LORazepam (ATIVAN) tablet 0.5 mg (0.5 mg Oral Given 07/26/17 1000)     Initial Impression / Assessment and Plan / ED Course  I have reviewed the triage vital signs and the nursing notes.  Pertinent labs & imaging results that were available during my care of the patient were reviewed by me and considered in my medical decision making (see chart for details).     77 year old female with thoracic back pain near the left scapula which radiates to the axilla.  Seems highly consistent with a musculoskeletal etiology.  No pain when still.  Significant exacerbated when she moves, particularly at the left shoulder. She is tender with palpation. Pain is worse with breathing but she does not necessarily feel short of breath.  She is not coughing.  She sounds clear on exam.  Oxygen saturations are normal on room air.  Her chest x-ray does not show any acute abnormality.  I do not see any rash or other skin lesions suggestive of shingles or  infectious process.  There is no crepitus.  I doubt PE.  She is treated symptomatically with some improvement.  Plan continue symptomatic treatment.  Return precautions were discussed.  Final Clinical Impressions(s) / ED Diagnoses   Final diagnoses:  Upper back pain on left side    ED Discharge Orders    None       Virgel Manifold, MD 07/26/17 1644

## 2017-08-01 DIAGNOSIS — H524 Presbyopia: Secondary | ICD-10-CM | POA: Diagnosis not present

## 2017-08-01 DIAGNOSIS — Z7984 Long term (current) use of oral hypoglycemic drugs: Secondary | ICD-10-CM | POA: Diagnosis not present

## 2017-08-01 DIAGNOSIS — H43393 Other vitreous opacities, bilateral: Secondary | ICD-10-CM | POA: Diagnosis not present

## 2017-08-01 DIAGNOSIS — H52203 Unspecified astigmatism, bilateral: Secondary | ICD-10-CM | POA: Diagnosis not present

## 2017-08-01 DIAGNOSIS — H18413 Arcus senilis, bilateral: Secondary | ICD-10-CM | POA: Diagnosis not present

## 2017-08-01 DIAGNOSIS — Z961 Presence of intraocular lens: Secondary | ICD-10-CM | POA: Diagnosis not present

## 2017-08-01 DIAGNOSIS — H43813 Vitreous degeneration, bilateral: Secondary | ICD-10-CM | POA: Diagnosis not present

## 2017-08-01 DIAGNOSIS — E119 Type 2 diabetes mellitus without complications: Secondary | ICD-10-CM | POA: Diagnosis not present

## 2017-08-03 DIAGNOSIS — R1012 Left upper quadrant pain: Secondary | ICD-10-CM | POA: Diagnosis not present

## 2017-08-03 DIAGNOSIS — E559 Vitamin D deficiency, unspecified: Secondary | ICD-10-CM | POA: Diagnosis not present

## 2017-08-03 DIAGNOSIS — E785 Hyperlipidemia, unspecified: Secondary | ICD-10-CM | POA: Diagnosis not present

## 2017-08-03 DIAGNOSIS — I10 Essential (primary) hypertension: Secondary | ICD-10-CM | POA: Diagnosis not present

## 2017-08-03 DIAGNOSIS — K219 Gastro-esophageal reflux disease without esophagitis: Secondary | ICD-10-CM | POA: Diagnosis not present

## 2017-08-03 DIAGNOSIS — E1165 Type 2 diabetes mellitus with hyperglycemia: Secondary | ICD-10-CM | POA: Diagnosis not present

## 2017-08-03 DIAGNOSIS — Z87891 Personal history of nicotine dependence: Secondary | ICD-10-CM | POA: Diagnosis not present

## 2017-08-03 DIAGNOSIS — M545 Low back pain: Secondary | ICD-10-CM | POA: Diagnosis not present

## 2017-08-23 DIAGNOSIS — Z86010 Personal history of colonic polyps: Secondary | ICD-10-CM | POA: Diagnosis not present

## 2017-08-23 DIAGNOSIS — G43A0 Cyclical vomiting, not intractable: Secondary | ICD-10-CM | POA: Diagnosis not present

## 2017-08-23 DIAGNOSIS — K219 Gastro-esophageal reflux disease without esophagitis: Secondary | ICD-10-CM | POA: Diagnosis not present

## 2017-08-23 DIAGNOSIS — R1310 Dysphagia, unspecified: Secondary | ICD-10-CM | POA: Diagnosis not present

## 2017-08-28 DIAGNOSIS — Z87891 Personal history of nicotine dependence: Secondary | ICD-10-CM | POA: Diagnosis not present

## 2017-08-28 DIAGNOSIS — M545 Low back pain: Secondary | ICD-10-CM | POA: Diagnosis not present

## 2017-08-28 DIAGNOSIS — E1165 Type 2 diabetes mellitus with hyperglycemia: Secondary | ICD-10-CM | POA: Diagnosis not present

## 2017-08-28 DIAGNOSIS — K219 Gastro-esophageal reflux disease without esophagitis: Secondary | ICD-10-CM | POA: Diagnosis not present

## 2017-08-28 DIAGNOSIS — Z0000 Encounter for general adult medical examination without abnormal findings: Secondary | ICD-10-CM | POA: Diagnosis not present

## 2017-08-28 DIAGNOSIS — I10 Essential (primary) hypertension: Secondary | ICD-10-CM | POA: Diagnosis not present

## 2017-08-28 DIAGNOSIS — E559 Vitamin D deficiency, unspecified: Secondary | ICD-10-CM | POA: Diagnosis not present

## 2017-08-28 DIAGNOSIS — E785 Hyperlipidemia, unspecified: Secondary | ICD-10-CM | POA: Diagnosis not present

## 2017-09-18 DIAGNOSIS — K209 Esophagitis, unspecified: Secondary | ICD-10-CM | POA: Diagnosis not present

## 2017-09-18 DIAGNOSIS — R1310 Dysphagia, unspecified: Secondary | ICD-10-CM | POA: Diagnosis not present

## 2017-09-18 DIAGNOSIS — D123 Benign neoplasm of transverse colon: Secondary | ICD-10-CM | POA: Diagnosis not present

## 2017-09-18 DIAGNOSIS — K449 Diaphragmatic hernia without obstruction or gangrene: Secondary | ICD-10-CM | POA: Diagnosis not present

## 2017-09-18 DIAGNOSIS — Z1211 Encounter for screening for malignant neoplasm of colon: Secondary | ICD-10-CM | POA: Diagnosis not present

## 2017-09-18 DIAGNOSIS — K222 Esophageal obstruction: Secondary | ICD-10-CM | POA: Diagnosis not present

## 2017-09-18 DIAGNOSIS — Z86010 Personal history of colonic polyps: Secondary | ICD-10-CM | POA: Diagnosis not present

## 2017-09-18 DIAGNOSIS — K648 Other hemorrhoids: Secondary | ICD-10-CM | POA: Diagnosis not present

## 2017-09-18 DIAGNOSIS — K640 First degree hemorrhoids: Secondary | ICD-10-CM | POA: Diagnosis not present

## 2017-09-18 DIAGNOSIS — D126 Benign neoplasm of colon, unspecified: Secondary | ICD-10-CM | POA: Diagnosis not present

## 2017-11-27 DIAGNOSIS — Z87891 Personal history of nicotine dependence: Secondary | ICD-10-CM | POA: Diagnosis not present

## 2017-11-27 DIAGNOSIS — I10 Essential (primary) hypertension: Secondary | ICD-10-CM | POA: Diagnosis not present

## 2017-11-27 DIAGNOSIS — Z5181 Encounter for therapeutic drug level monitoring: Secondary | ICD-10-CM | POA: Diagnosis not present

## 2017-11-27 DIAGNOSIS — Z136 Encounter for screening for cardiovascular disorders: Secondary | ICD-10-CM | POA: Diagnosis not present

## 2017-11-27 DIAGNOSIS — Z113 Encounter for screening for infections with a predominantly sexual mode of transmission: Secondary | ICD-10-CM | POA: Diagnosis not present

## 2017-11-27 DIAGNOSIS — E559 Vitamin D deficiency, unspecified: Secondary | ICD-10-CM | POA: Diagnosis not present

## 2017-11-27 DIAGNOSIS — E785 Hyperlipidemia, unspecified: Secondary | ICD-10-CM | POA: Diagnosis not present

## 2017-11-27 DIAGNOSIS — M545 Low back pain: Secondary | ICD-10-CM | POA: Diagnosis not present

## 2017-11-27 DIAGNOSIS — Z0000 Encounter for general adult medical examination without abnormal findings: Secondary | ICD-10-CM | POA: Diagnosis not present

## 2017-11-27 DIAGNOSIS — Z01118 Encounter for examination of ears and hearing with other abnormal findings: Secondary | ICD-10-CM | POA: Diagnosis not present

## 2017-11-27 DIAGNOSIS — Z131 Encounter for screening for diabetes mellitus: Secondary | ICD-10-CM | POA: Diagnosis not present

## 2017-11-27 DIAGNOSIS — K219 Gastro-esophageal reflux disease without esophagitis: Secondary | ICD-10-CM | POA: Diagnosis not present

## 2017-11-27 DIAGNOSIS — E1165 Type 2 diabetes mellitus with hyperglycemia: Secondary | ICD-10-CM | POA: Diagnosis not present

## 2018-01-08 DIAGNOSIS — E785 Hyperlipidemia, unspecified: Secondary | ICD-10-CM | POA: Diagnosis not present

## 2018-01-08 DIAGNOSIS — M545 Low back pain: Secondary | ICD-10-CM | POA: Diagnosis not present

## 2018-01-08 DIAGNOSIS — E559 Vitamin D deficiency, unspecified: Secondary | ICD-10-CM | POA: Diagnosis not present

## 2018-01-08 DIAGNOSIS — I10 Essential (primary) hypertension: Secondary | ICD-10-CM | POA: Diagnosis not present

## 2018-01-08 DIAGNOSIS — Z87891 Personal history of nicotine dependence: Secondary | ICD-10-CM | POA: Diagnosis not present

## 2018-01-08 DIAGNOSIS — K219 Gastro-esophageal reflux disease without esophagitis: Secondary | ICD-10-CM | POA: Diagnosis not present

## 2018-01-08 DIAGNOSIS — E1165 Type 2 diabetes mellitus with hyperglycemia: Secondary | ICD-10-CM | POA: Diagnosis not present

## 2018-02-09 DIAGNOSIS — H1013 Acute atopic conjunctivitis, bilateral: Secondary | ICD-10-CM | POA: Diagnosis not present

## 2018-04-06 DIAGNOSIS — I10 Essential (primary) hypertension: Secondary | ICD-10-CM | POA: Diagnosis not present

## 2018-04-06 DIAGNOSIS — Z87891 Personal history of nicotine dependence: Secondary | ICD-10-CM | POA: Diagnosis not present

## 2018-04-06 DIAGNOSIS — E1165 Type 2 diabetes mellitus with hyperglycemia: Secondary | ICD-10-CM | POA: Diagnosis not present

## 2018-04-06 DIAGNOSIS — K5909 Other constipation: Secondary | ICD-10-CM | POA: Diagnosis not present

## 2018-04-06 DIAGNOSIS — E559 Vitamin D deficiency, unspecified: Secondary | ICD-10-CM | POA: Diagnosis not present

## 2018-04-06 DIAGNOSIS — E785 Hyperlipidemia, unspecified: Secondary | ICD-10-CM | POA: Diagnosis not present

## 2018-04-06 DIAGNOSIS — K219 Gastro-esophageal reflux disease without esophagitis: Secondary | ICD-10-CM | POA: Diagnosis not present

## 2018-04-18 DIAGNOSIS — R9431 Abnormal electrocardiogram [ECG] [EKG]: Secondary | ICD-10-CM | POA: Diagnosis not present

## 2018-04-18 DIAGNOSIS — R42 Dizziness and giddiness: Secondary | ICD-10-CM | POA: Diagnosis not present

## 2018-04-18 DIAGNOSIS — I499 Cardiac arrhythmia, unspecified: Secondary | ICD-10-CM | POA: Diagnosis not present

## 2018-04-18 DIAGNOSIS — R509 Fever, unspecified: Secondary | ICD-10-CM | POA: Diagnosis not present

## 2018-04-18 DIAGNOSIS — B349 Viral infection, unspecified: Secondary | ICD-10-CM | POA: Diagnosis not present

## 2018-04-18 DIAGNOSIS — R05 Cough: Secondary | ICD-10-CM | POA: Diagnosis not present

## 2018-04-18 DIAGNOSIS — R197 Diarrhea, unspecified: Secondary | ICD-10-CM | POA: Diagnosis not present

## 2018-04-20 DIAGNOSIS — I10 Essential (primary) hypertension: Secondary | ICD-10-CM | POA: Diagnosis not present

## 2018-04-20 DIAGNOSIS — E559 Vitamin D deficiency, unspecified: Secondary | ICD-10-CM | POA: Diagnosis not present

## 2018-04-20 DIAGNOSIS — K219 Gastro-esophageal reflux disease without esophagitis: Secondary | ICD-10-CM | POA: Diagnosis not present

## 2018-04-20 DIAGNOSIS — Z87891 Personal history of nicotine dependence: Secondary | ICD-10-CM | POA: Diagnosis not present

## 2018-04-20 DIAGNOSIS — E785 Hyperlipidemia, unspecified: Secondary | ICD-10-CM | POA: Diagnosis not present

## 2018-04-20 DIAGNOSIS — E1165 Type 2 diabetes mellitus with hyperglycemia: Secondary | ICD-10-CM | POA: Diagnosis not present

## 2018-04-20 DIAGNOSIS — R1013 Epigastric pain: Secondary | ICD-10-CM | POA: Diagnosis not present

## 2018-05-04 DIAGNOSIS — R1013 Epigastric pain: Secondary | ICD-10-CM | POA: Diagnosis not present

## 2018-05-04 DIAGNOSIS — E785 Hyperlipidemia, unspecified: Secondary | ICD-10-CM | POA: Diagnosis not present

## 2018-05-04 DIAGNOSIS — Z87891 Personal history of nicotine dependence: Secondary | ICD-10-CM | POA: Diagnosis not present

## 2018-05-04 DIAGNOSIS — K219 Gastro-esophageal reflux disease without esophagitis: Secondary | ICD-10-CM | POA: Diagnosis not present

## 2018-05-04 DIAGNOSIS — I10 Essential (primary) hypertension: Secondary | ICD-10-CM | POA: Diagnosis not present

## 2018-05-04 DIAGNOSIS — E559 Vitamin D deficiency, unspecified: Secondary | ICD-10-CM | POA: Diagnosis not present

## 2018-05-04 DIAGNOSIS — E1165 Type 2 diabetes mellitus with hyperglycemia: Secondary | ICD-10-CM | POA: Diagnosis not present

## 2018-06-04 DIAGNOSIS — R1013 Epigastric pain: Secondary | ICD-10-CM | POA: Diagnosis not present

## 2018-06-04 DIAGNOSIS — K219 Gastro-esophageal reflux disease without esophagitis: Secondary | ICD-10-CM | POA: Diagnosis not present

## 2018-06-04 DIAGNOSIS — E559 Vitamin D deficiency, unspecified: Secondary | ICD-10-CM | POA: Diagnosis not present

## 2018-06-04 DIAGNOSIS — E785 Hyperlipidemia, unspecified: Secondary | ICD-10-CM | POA: Diagnosis not present

## 2018-06-04 DIAGNOSIS — Z87891 Personal history of nicotine dependence: Secondary | ICD-10-CM | POA: Diagnosis not present

## 2018-06-04 DIAGNOSIS — E1165 Type 2 diabetes mellitus with hyperglycemia: Secondary | ICD-10-CM | POA: Diagnosis not present

## 2018-06-04 DIAGNOSIS — I10 Essential (primary) hypertension: Secondary | ICD-10-CM | POA: Diagnosis not present

## 2018-08-06 ENCOUNTER — Encounter (HOSPITAL_BASED_OUTPATIENT_CLINIC_OR_DEPARTMENT_OTHER): Payer: Self-pay | Admitting: *Deleted

## 2018-08-06 ENCOUNTER — Emergency Department (HOSPITAL_BASED_OUTPATIENT_CLINIC_OR_DEPARTMENT_OTHER): Payer: Medicare HMO

## 2018-08-06 ENCOUNTER — Other Ambulatory Visit: Payer: Self-pay

## 2018-08-06 ENCOUNTER — Emergency Department (HOSPITAL_BASED_OUTPATIENT_CLINIC_OR_DEPARTMENT_OTHER)
Admission: EM | Admit: 2018-08-06 | Discharge: 2018-08-07 | Disposition: A | Discharge status: Home / Self Care | Payer: Medicare HMO | Attending: Emergency Medicine | Admitting: Emergency Medicine

## 2018-08-06 DIAGNOSIS — E876 Hypokalemia: Secondary | ICD-10-CM | POA: Insufficient documentation

## 2018-08-06 DIAGNOSIS — Z79899 Other long term (current) drug therapy: Secondary | ICD-10-CM | POA: Insufficient documentation

## 2018-08-06 DIAGNOSIS — R110 Nausea: Secondary | ICD-10-CM | POA: Diagnosis not present

## 2018-08-06 DIAGNOSIS — R42 Dizziness and giddiness: Secondary | ICD-10-CM | POA: Insufficient documentation

## 2018-08-06 DIAGNOSIS — R531 Weakness: Secondary | ICD-10-CM | POA: Diagnosis not present

## 2018-08-06 DIAGNOSIS — R350 Frequency of micturition: Secondary | ICD-10-CM | POA: Insufficient documentation

## 2018-08-06 DIAGNOSIS — R55 Syncope and collapse: Secondary | ICD-10-CM | POA: Insufficient documentation

## 2018-08-06 DIAGNOSIS — E119 Type 2 diabetes mellitus without complications: Secondary | ICD-10-CM | POA: Insufficient documentation

## 2018-08-06 DIAGNOSIS — I10 Essential (primary) hypertension: Secondary | ICD-10-CM | POA: Diagnosis not present

## 2018-08-06 DIAGNOSIS — Z87891 Personal history of nicotine dependence: Secondary | ICD-10-CM | POA: Diagnosis not present

## 2018-08-06 DIAGNOSIS — Z7984 Long term (current) use of oral hypoglycemic drugs: Secondary | ICD-10-CM | POA: Insufficient documentation

## 2018-08-06 LAB — URINALYSIS, ROUTINE W REFLEX MICROSCOPIC
Bilirubin Urine: NEGATIVE
Glucose, UA: NEGATIVE mg/dL
Hgb urine dipstick: NEGATIVE
Ketones, ur: NEGATIVE mg/dL
Leukocytes, UA: NEGATIVE
Nitrite: NEGATIVE
Protein, ur: NEGATIVE mg/dL
Specific Gravity, Urine: <1.005 — ABNORMAL LOW (ref 1.005–1.030)
pH: 7 (ref 5.0–8.0)

## 2018-08-06 LAB — COMPREHENSIVE METABOLIC PANEL
ALT: 18 U/L (ref 0–44)
AST: 20 U/L (ref 15–41)
Albumin: 4.4 g/dL (ref 3.5–5.0)
Alkaline Phosphatase: 89 U/L (ref 38–126)
Anion gap: 12 (ref 5–15)
BUN: 13 mg/dL (ref 8–23)
CO2: 26 mmol/L (ref 22–32)
Calcium: 9.7 mg/dL (ref 8.9–10.3)
Chloride: 93 mmol/L — ABNORMAL LOW (ref 98–111)
Creatinine, Ser: 0.95 mg/dL (ref 0.44–1.00)
GFR calc Af Amer: >60 mL/min (ref >60)
GFR calc non Af Amer: 59 mL/min — ABNORMAL LOW (ref >60)
Glucose, Bld: 103 mg/dL — ABNORMAL HIGH (ref 70–99)
Potassium: 2.7 mmol/L — CL (ref 3.5–5.1)
Sodium: 131 mmol/L — ABNORMAL LOW (ref 135–145)
Total Bilirubin: 1.1 mg/dL (ref 0.3–1.2)
Total Protein: 7.9 g/dL (ref 6.5–8.1)

## 2018-08-06 LAB — CBC WITH DIFFERENTIAL/PLATELET
Abs Immature Granulocytes: 0.02 10*3/uL (ref 0.00–0.07)
Basophils Absolute: 0 10*3/uL (ref 0.0–0.1)
Basophils Relative: 0 %
Eosinophils Absolute: 0.2 10*3/uL (ref 0.0–0.5)
Eosinophils Relative: 2 %
HEMATOCRIT: 36.9 % (ref 36.0–46.0)
Hemoglobin: 11.9 g/dL — ABNORMAL LOW (ref 12.0–15.0)
IMMATURE GRANULOCYTES: 0 %
Lymphocytes Relative: 39 %
Lymphs Abs: 3.1 10*3/uL (ref 0.7–4.0)
MCH: 26.9 pg (ref 26.0–34.0)
MCHC: 32.2 g/dL (ref 30.0–36.0)
MCV: 83.3 fL (ref 80.0–100.0)
MONOS PCT: 9 %
Monocytes Absolute: 0.7 10*3/uL (ref 0.1–1.0)
NRBC: 0 % (ref 0.0–0.2)
Neutro Abs: 3.9 10*3/uL (ref 1.7–7.7)
Neutrophils Relative %: 50 %
Platelets: 299 10*3/uL (ref 150–400)
RBC: 4.43 MIL/uL (ref 3.87–5.11)
RDW: 14.2 % (ref 11.5–15.5)
WBC: 7.9 10*3/uL (ref 4.0–10.5)

## 2018-08-06 LAB — TROPONIN I: Troponin I: <0.03 ng/mL (ref <0.03)

## 2018-08-06 MED ORDER — SODIUM CHLORIDE 0.9 % IV BOLUS
500.0000 mL | Freq: Once | INTRAVENOUS | Status: AC
  Administered 2018-08-06: 500 mL via INTRAVENOUS

## 2018-08-06 MED ORDER — POTASSIUM CHLORIDE CRYS ER 20 MEQ PO TBCR: 20.0000 meq | EXTENDED_RELEASE_TABLET | Freq: Two times a day (BID) | ORAL | 0 refills | Status: AC

## 2018-08-06 MED ORDER — POTASSIUM CHLORIDE CRYS ER 20 MEQ PO TBCR
40.0000 meq | EXTENDED_RELEASE_TABLET | Freq: Once | ORAL | Status: AC
  Administered 2018-08-06: 40 meq via ORAL
  Filled 2018-08-06: qty 2

## 2018-08-06 MED ORDER — ONDANSETRON 4 MG PO TBDP: 4.0000 mg | ORAL_TABLET | Freq: Three times a day (TID) | ORAL | 0 refills | Status: AC | PRN

## 2018-08-06 MED ORDER — POTASSIUM CHLORIDE 10 MEQ/100ML IV SOLN
10.0000 meq | Freq: Once | INTRAVENOUS | Status: AC
  Administered 2018-08-06: 10 meq via INTRAVENOUS
  Filled 2018-08-06: qty 100

## 2018-08-06 NOTE — ED Triage Notes (Signed)
Nausea off and on x 3 days. Headache. Fever.

## 2018-08-06 NOTE — ED Notes (Signed)
Pt with multiple complaints...nausea, GERD (takes meds), legs weak, 1hr of blurred vision  @1930 , "head feels heavy"

## 2018-08-06 NOTE — ED Notes (Signed)
Pt resting in NAD. Family at bedside. Call bell within reach. Side rails up x 2

## 2018-08-06 NOTE — ED Provider Notes (Signed)
[facility] EMERGENCY DEPARTMENT Provider Note   CSN: [id number] Arrival date & time: 08/06/18  2005     History   Chief Complaint Chief Complaint  Patient presents with  . Nausea    HPI Caroline Fernandez is a 78 y.o. female.  HPI Patient presented with few different complaints.  States she feels some aches and pains.  States she has had nausea.  Felt lightheaded when she tried to stand up.  States she felt as though she could pass out.  States she sat down to feel better.  Has had some urinary frequency but states she has not been drinking any more.  No chest pain.  No cough.  Occasional dull headaches.  No sick contacts.  No chest pain. Past Medical History:  Diagnosis Date  . Bleeds easily ([place])   . Chronic lower back pain   . Coronary artery disease   . Daily headache    "lately" (10/09/2014)  . GERD (gastroesophageal reflux disease)   . Heart murmur   . High cholesterol   . History of stomach ulcers   . Hypertension   . Morbid obesity ([name])   . Renal disorder   . Type II diabetes mellitus [facility])     Patient Active Problem List   Diagnosis Date Noted  . Amaurosis fugax, both eyes 10/09/2014  . Hypertension 10/09/2014  . Morbid obesity ([place]) 10/09/2014  . Type II diabetes mellitus ([name]) 10/09/2014  . Hypokalemia due to loss of potassium 10/09/2014  . Bradycardia 10/09/2014  . Chronic diastolic heart failure ([place]) 10/09/2014  . Dizziness 10/09/2014  . Near syncope 08/24/2014  . Abdominal pain, epigastric 08/24/2014  . Hyponatremia 08/24/2014  . Hypokalemia 08/24/2014  . GERD (gastroesophageal reflux disease) 08/24/2014  . HTN (hypertension) 08/24/2014  . HLD (hyperlipidemia) 08/24/2014  . Diabetes ([place]) 08/24/2014    Past Surgical History:  Procedure Laterality Date  . ABDOMINAL HYSTERECTOMY    . CATARACT EXTRACTION W/ INTRAOCULAR LENS IMPLANT Left   . DILATION AND CURETTAGE OF UTERUS  "several"  . GLAUCOMA SURGERY Left ~ 07/2014  . LEFT  OOPHORECTOMY    . RIGHT OOPHORECTOMY    . TUBAL LIGATION       OB History   No obstetric history on file.      Home Medications    Prior to Admission medications   Medication Sig Start Date End Date Taking? Authorizing Provider  amLODipine (NORVASC) 5 MG tablet Take 5 mg by mouth daily.    [provider]  atorvastatin (LIPITOR) 20 MG tablet Take 20 mg by mouth at bedtime.     [provider]  cholecalciferol (VITAMIN D) 400 UNITS TABS tablet Take 800 Units by mouth daily.    [provider]  hydrochlorothiazide (HYDRODIURIL) 25 MG tablet Take 25 mg by mouth daily.    [provider]  HYDROcodone-acetaminophen (NORCO/VICODIN) 5-325 MG tablet Take 1 tablet by mouth every 6 (six) hours as needed. 07/26/17   Virgel Manifold, MD  ibuprofen (ADVIL,MOTRIN) 200 MG tablet Take 1-2 tablets (200-400 mg total) by mouth every 8 (eight) hours as needed. 07/26/17   Virgel Manifold, MD  lisinopril (PRINIVIL,ZESTRIL) 5 MG tablet Take 1 tablet (5 mg total) by mouth daily. 10/10/14   Annita Brod, MD  metFORMIN (GLUCOPHAGE) 500 MG tablet Take 500 mg by mouth daily with breakfast.    [provider]  naphazoline (NAPHCON) 0.1 % ophthalmic solution Place 1 drop into the left eye 4 (four) times daily as needed.  Patient taking differently: Place 1 drop into both eyes 4 (four) times daily as needed for irritation (itching).  05/18/12   Leonard Schwartz, MD  ondansetron (ZOFRAN-ODT) 4 MG disintegrating tablet Take 1 tablet (4 mg total) by mouth every 8 (eight) hours as needed for nausea or vomiting. 08/06/18   Davonna Belling, MD  pantoprazole (PROTONIX) 40 MG tablet Take 40 mg by mouth daily.    [provider]  potassium chloride (K-DUR) 10 MEQ tablet Take 20 mEq by mouth daily.     [provider]  potassium chloride SA (K-DUR,KLOR-CON) 20 MEQ tablet Take 1 tablet (20 mEq total) by mouth 2 (two) times daily. 08/06/18   Davonna Belling, MD    predniSONE (DELTASONE) 20 MG tablet Take 1 tablet (20 mg total) by mouth daily. 07/26/17   Virgel Manifold, MD  sucralfate (CARAFATE) 1 g tablet Take 1 g by mouth 4 (four) times daily -  with meals and at bedtime.    [provider]    Family History No family history on file.  Social History Social History   Tobacco Use  . Smoking status: Never Smoker  . Smokeless tobacco: Former Systems developer    Types: Snuff  . Tobacco comment: "quit using snuff in the 1990's"  Substance Use Topics  . 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 Drug use: No     Allergies   Aspirin; Lisinopril; and Amoxicillin   Review of Systems Review of Systems  Constitutional: Positive for chills. Negative for appetite change.  HENT: Negative for congestion.   Respiratory: Negative for shortness of breath.   Cardiovascular: Negative for chest pain.  Gastrointestinal: Positive for nausea.  Genitourinary: Negative for flank pain.  Musculoskeletal: Negative for back pain.  Skin: Negative for rash.  Neurological: Positive for light-headedness and headaches.  Psychiatric/Behavioral: Positive for confusion.     Physical Exam Updated Vital Signs BP (!) 151/80 (BP Location: Right Arm)   Pulse 68   Temp 98.5 F (36.9 C) (Oral)   Resp 16   Ht 5\' 3"  (1.6 m)   Wt 81.6 kg   SpO2 100%   BMI 31.89 kg/m   Physical Exam HENT:     Head: Normocephalic.     Mouth/Throat:     Mouth: Mucous membranes are moist.  Eyes:     Extraocular Movements: Extraocular movements intact.  Neck:     Musculoskeletal: Neck supple.  Cardiovascular:     Rate and Rhythm: Normal rate.  Pulmonary:     Breath sounds: No wheezing or rales.  Abdominal:     Tenderness: There is no abdominal tenderness.  Musculoskeletal:     Comments: Mild bilateral lower extremity pitting edema.  Skin:    General: Skin is warm.     Capillary Refill: Capillary refill takes less than 2 seconds.  Neurological:      General: No focal deficit present.     Mental Status: She is alert and oriented to person, place, and time.  Psychiatric:        Mood and Affect: Mood normal.      ED Treatments / Results  Labs (all labs ordered are listed, but only abnormal results are displayed) Labs Reviewed  CBC WITH DIFFERENTIAL/PLATELET - Abnormal; Notable for the following components:      Result Value   Hemoglobin 11.9 (*)    All other components within normal limits  URINALYSIS, ROUTINE W REFLEX MICROSCOPIC - Abnormal; Notable  for the following components:   Specific Gravity, Urine <1.005 (*)    All other components within normal limits  COMPREHENSIVE METABOLIC PANEL - Abnormal; Notable for the following components:   Sodium 131 (*)    Potassium 2.7 (*)    Chloride 93 (*)    Glucose, Bld 103 (*)    GFR calc non Af Amer 59 (*)    All other components within normal limits  TROPONIN I    EKG EKG Interpretation  Date/Time:  Monday August 06 2018 20:19:29 EST Ventricular Rate:  67 PR Interval:  172 QRS Duration: 86 QT Interval:  420 QTC Calculation: 443 R Axis:   65 Text Interpretation:  Normal sinus rhythm T wave abnormality, consider lateral ischemia Abnormal ECG No significant change since last tracing Confirmed by Davonna Belling [phone]) on 08/06/2018 8:38:59 PM   Radiology Dg Chest 2 View  Result Date: 08/06/2018 CLINICAL DATA:  Headache and weakness. Dizziness. History scoliosis. EXAM: CHEST - 2 VIEW COMPARISON:  April 18, 2018 FINDINGS: Severe scoliotic changes remain in the thoracic spine. The cardiomediastinal silhouette is stable. No pneumothorax. No pulmonary nodules, masses, or focal infiltrates. IMPRESSION: No active cardiopulmonary disease. Electronically Signed   By: Dorise Bullion III M.D   On: 08/06/2018 21:15    Procedures Procedures (including critical care time)  Medications Ordered in ED Medications  sodium chloride 0.9 % bolus 500 mL (0 mLs Intravenous Stopped  08/06/18 2228)  potassium chloride 10 mEq in 100 mL IVPB (10 mEq Intravenous New Bag/Given 08/06/18 2250)  potassium chloride SA (K-DUR,KLOR-CON) CR tablet 40 mEq (40 mEq Oral Given 08/06/18 2246)     Initial Impression / Assessment and Plan / ED Course  I have reviewed the triage vital signs and the nursing notes.  Pertinent labs & imaging results that were available during my care of the patient were reviewed by me and considered in my medical decision making (see chart for details).     Patient with a few different complaints.  I think there may be an underlying viral disease.  Has had some chills.  Some nausea.  Had a near syncopal episode.  Feels much better after IV fluid.  Has hypokalemia that has been supplemented.  Will discharge home.  Final Clinical Impressions(s) / ED Diagnoses   Final diagnoses:  Hypokalemia  Near syncope  Nausea    ED Discharge Orders         Ordered    ondansetron (ZOFRAN-ODT) 4 MG disintegrating tablet  Every 8 hours PRN     08/06/18 2349    potassium chloride SA (K-DUR,KLOR-CON) 20 MEQ tablet  2 times daily     08/06/18 2349           Davonna Belling, MD 08/06/18 2353

## 2018-09-03 DIAGNOSIS — E1165 Type 2 diabetes mellitus with hyperglycemia: Secondary | ICD-10-CM | POA: Diagnosis not present

## 2018-09-03 DIAGNOSIS — Z87891 Personal history of nicotine dependence: Secondary | ICD-10-CM | POA: Diagnosis not present

## 2018-09-03 DIAGNOSIS — Z0001 Encounter for general adult medical examination with abnormal findings: Secondary | ICD-10-CM | POA: Diagnosis not present

## 2018-09-03 DIAGNOSIS — R1013 Epigastric pain: Secondary | ICD-10-CM | POA: Diagnosis not present

## 2018-09-03 DIAGNOSIS — Z1329 Encounter for screening for other suspected endocrine disorder: Secondary | ICD-10-CM | POA: Diagnosis not present

## 2018-09-03 DIAGNOSIS — E785 Hyperlipidemia, unspecified: Secondary | ICD-10-CM | POA: Diagnosis not present

## 2018-09-03 DIAGNOSIS — Z131 Encounter for screening for diabetes mellitus: Secondary | ICD-10-CM | POA: Diagnosis not present

## 2018-09-03 DIAGNOSIS — K219 Gastro-esophageal reflux disease without esophagitis: Secondary | ICD-10-CM | POA: Diagnosis not present

## 2018-09-03 DIAGNOSIS — I10 Essential (primary) hypertension: Secondary | ICD-10-CM | POA: Diagnosis not present

## 2018-09-03 DIAGNOSIS — E559 Vitamin D deficiency, unspecified: Secondary | ICD-10-CM | POA: Diagnosis not present

## 2018-09-04 DIAGNOSIS — H43393 Other vitreous opacities, bilateral: Secondary | ICD-10-CM | POA: Diagnosis not present

## 2018-09-04 DIAGNOSIS — H11003 Unspecified pterygium of eye, bilateral: Secondary | ICD-10-CM | POA: Diagnosis not present

## 2018-09-04 DIAGNOSIS — E119 Type 2 diabetes mellitus without complications: Secondary | ICD-10-CM | POA: Diagnosis not present

## 2018-09-04 DIAGNOSIS — Z7984 Long term (current) use of oral hypoglycemic drugs: Secondary | ICD-10-CM | POA: Diagnosis not present

## 2018-09-04 DIAGNOSIS — H524 Presbyopia: Secondary | ICD-10-CM | POA: Diagnosis not present

## 2018-09-04 DIAGNOSIS — Z961 Presence of intraocular lens: Secondary | ICD-10-CM | POA: Diagnosis not present

## 2018-09-04 DIAGNOSIS — H52203 Unspecified astigmatism, bilateral: Secondary | ICD-10-CM | POA: Diagnosis not present

## 2018-09-04 DIAGNOSIS — H43813 Vitreous degeneration, bilateral: Secondary | ICD-10-CM | POA: Diagnosis not present

## 2018-09-04 DIAGNOSIS — H18413 Arcus senilis, bilateral: Secondary | ICD-10-CM | POA: Diagnosis not present

## 2018-09-24 DIAGNOSIS — I10 Essential (primary) hypertension: Secondary | ICD-10-CM | POA: Diagnosis not present

## 2018-09-24 DIAGNOSIS — Z87891 Personal history of nicotine dependence: Secondary | ICD-10-CM | POA: Diagnosis not present

## 2018-09-24 DIAGNOSIS — E559 Vitamin D deficiency, unspecified: Secondary | ICD-10-CM | POA: Diagnosis not present

## 2018-09-24 DIAGNOSIS — K219 Gastro-esophageal reflux disease without esophagitis: Secondary | ICD-10-CM | POA: Diagnosis not present

## 2018-09-24 DIAGNOSIS — E785 Hyperlipidemia, unspecified: Secondary | ICD-10-CM | POA: Diagnosis not present

## 2018-09-24 DIAGNOSIS — E1165 Type 2 diabetes mellitus with hyperglycemia: Secondary | ICD-10-CM | POA: Diagnosis not present

## 2018-10-12 DIAGNOSIS — I10 Essential (primary) hypertension: Secondary | ICD-10-CM | POA: Diagnosis not present

## 2018-10-17 DIAGNOSIS — I10 Essential (primary) hypertension: Secondary | ICD-10-CM | POA: Diagnosis not present

## 2018-10-17 DIAGNOSIS — K219 Gastro-esophageal reflux disease without esophagitis: Secondary | ICD-10-CM | POA: Diagnosis not present

## 2018-10-17 DIAGNOSIS — E559 Vitamin D deficiency, unspecified: Secondary | ICD-10-CM | POA: Diagnosis not present

## 2018-10-17 DIAGNOSIS — Z87891 Personal history of nicotine dependence: Secondary | ICD-10-CM | POA: Diagnosis not present

## 2018-10-17 DIAGNOSIS — R42 Dizziness and giddiness: Secondary | ICD-10-CM | POA: Diagnosis not present

## 2018-10-17 DIAGNOSIS — E1165 Type 2 diabetes mellitus with hyperglycemia: Secondary | ICD-10-CM | POA: Diagnosis not present

## 2018-10-17 DIAGNOSIS — E782 Mixed hyperlipidemia: Secondary | ICD-10-CM | POA: Diagnosis not present

## 2018-10-29 DIAGNOSIS — E782 Mixed hyperlipidemia: Secondary | ICD-10-CM | POA: Diagnosis not present

## 2018-10-29 DIAGNOSIS — Z87891 Personal history of nicotine dependence: Secondary | ICD-10-CM | POA: Diagnosis not present

## 2018-10-29 DIAGNOSIS — I10 Essential (primary) hypertension: Secondary | ICD-10-CM | POA: Diagnosis not present

## 2018-10-29 DIAGNOSIS — E559 Vitamin D deficiency, unspecified: Secondary | ICD-10-CM | POA: Diagnosis not present

## 2018-10-29 DIAGNOSIS — K219 Gastro-esophageal reflux disease without esophagitis: Secondary | ICD-10-CM | POA: Diagnosis not present

## 2018-10-29 DIAGNOSIS — E1165 Type 2 diabetes mellitus with hyperglycemia: Secondary | ICD-10-CM | POA: Diagnosis not present

## 2018-11-26 DIAGNOSIS — E782 Mixed hyperlipidemia: Secondary | ICD-10-CM | POA: Diagnosis not present

## 2018-11-26 DIAGNOSIS — E559 Vitamin D deficiency, unspecified: Secondary | ICD-10-CM | POA: Diagnosis not present

## 2018-11-26 DIAGNOSIS — Z01118 Encounter for examination of ears and hearing with other abnormal findings: Secondary | ICD-10-CM | POA: Diagnosis not present

## 2018-11-26 DIAGNOSIS — Z1389 Encounter for screening for other disorder: Secondary | ICD-10-CM | POA: Diagnosis not present

## 2018-11-26 DIAGNOSIS — Z136 Encounter for screening for cardiovascular disorders: Secondary | ICD-10-CM | POA: Diagnosis not present

## 2018-11-26 DIAGNOSIS — Z01021 Encounter for examination of eyes and vision following failed vision screening with abnormal findings: Secondary | ICD-10-CM | POA: Diagnosis not present

## 2018-11-26 DIAGNOSIS — Z131 Encounter for screening for diabetes mellitus: Secondary | ICD-10-CM | POA: Diagnosis not present

## 2018-11-26 DIAGNOSIS — Z1329 Encounter for screening for other suspected endocrine disorder: Secondary | ICD-10-CM | POA: Diagnosis not present

## 2018-11-26 DIAGNOSIS — Z0001 Encounter for general adult medical examination with abnormal findings: Secondary | ICD-10-CM | POA: Diagnosis not present

## 2018-11-26 DIAGNOSIS — I10 Essential (primary) hypertension: Secondary | ICD-10-CM | POA: Diagnosis not present

## 2018-11-26 DIAGNOSIS — E1165 Type 2 diabetes mellitus with hyperglycemia: Secondary | ICD-10-CM | POA: Diagnosis not present

## 2018-11-26 DIAGNOSIS — Z5181 Encounter for therapeutic drug level monitoring: Secondary | ICD-10-CM | POA: Diagnosis not present

## 2018-11-26 DIAGNOSIS — K219 Gastro-esophageal reflux disease without esophagitis: Secondary | ICD-10-CM | POA: Diagnosis not present

## 2019-01-09 DIAGNOSIS — E119 Type 2 diabetes mellitus without complications: Secondary | ICD-10-CM | POA: Diagnosis not present

## 2019-02-25 DIAGNOSIS — E782 Mixed hyperlipidemia: Secondary | ICD-10-CM | POA: Diagnosis not present

## 2019-02-25 DIAGNOSIS — K219 Gastro-esophageal reflux disease without esophagitis: Secondary | ICD-10-CM | POA: Diagnosis not present

## 2019-02-25 DIAGNOSIS — I10 Essential (primary) hypertension: Secondary | ICD-10-CM | POA: Diagnosis not present

## 2019-02-25 DIAGNOSIS — E559 Vitamin D deficiency, unspecified: Secondary | ICD-10-CM | POA: Diagnosis not present

## 2019-02-25 DIAGNOSIS — E1165 Type 2 diabetes mellitus with hyperglycemia: Secondary | ICD-10-CM | POA: Diagnosis not present

## 2019-02-25 DIAGNOSIS — M545 Low back pain: Secondary | ICD-10-CM | POA: Diagnosis not present

## 2019-05-06 DIAGNOSIS — K219 Gastro-esophageal reflux disease without esophagitis: Secondary | ICD-10-CM | POA: Diagnosis not present

## 2019-05-06 DIAGNOSIS — I10 Essential (primary) hypertension: Secondary | ICD-10-CM | POA: Diagnosis not present

## 2019-05-06 DIAGNOSIS — E559 Vitamin D deficiency, unspecified: Secondary | ICD-10-CM | POA: Diagnosis not present

## 2019-05-06 DIAGNOSIS — Z2821 Immunization not carried out because of patient refusal: Secondary | ICD-10-CM | POA: Diagnosis not present

## 2019-05-06 DIAGNOSIS — E1165 Type 2 diabetes mellitus with hyperglycemia: Secondary | ICD-10-CM | POA: Diagnosis not present

## 2019-05-06 DIAGNOSIS — E782 Mixed hyperlipidemia: Secondary | ICD-10-CM | POA: Diagnosis not present

## 2019-05-13 DIAGNOSIS — I10 Essential (primary) hypertension: Secondary | ICD-10-CM | POA: Diagnosis not present

## 2019-05-13 DIAGNOSIS — E1169 Type 2 diabetes mellitus with other specified complication: Secondary | ICD-10-CM | POA: Diagnosis not present

## 2019-05-13 DIAGNOSIS — L989 Disorder of the skin and subcutaneous tissue, unspecified: Secondary | ICD-10-CM | POA: Diagnosis not present

## 2019-05-13 DIAGNOSIS — E876 Hypokalemia: Secondary | ICD-10-CM | POA: Diagnosis not present

## 2019-05-13 DIAGNOSIS — G8929 Other chronic pain: Secondary | ICD-10-CM | POA: Diagnosis not present

## 2019-05-13 DIAGNOSIS — E785 Hyperlipidemia, unspecified: Secondary | ICD-10-CM | POA: Diagnosis not present

## 2019-05-13 DIAGNOSIS — E669 Obesity, unspecified: Secondary | ICD-10-CM | POA: Diagnosis not present

## 2019-05-13 DIAGNOSIS — K219 Gastro-esophageal reflux disease without esophagitis: Secondary | ICD-10-CM | POA: Diagnosis not present

## 2019-05-13 DIAGNOSIS — M545 Low back pain: Secondary | ICD-10-CM | POA: Diagnosis not present

## 2019-05-20 DIAGNOSIS — G8929 Other chronic pain: Secondary | ICD-10-CM | POA: Diagnosis not present

## 2019-05-20 DIAGNOSIS — Z0001 Encounter for general adult medical examination with abnormal findings: Secondary | ICD-10-CM | POA: Diagnosis not present

## 2019-05-20 DIAGNOSIS — Z0000 Encounter for general adult medical examination without abnormal findings: Secondary | ICD-10-CM | POA: Diagnosis not present

## 2019-05-20 DIAGNOSIS — Z1231 Encounter for screening mammogram for malignant neoplasm of breast: Secondary | ICD-10-CM | POA: Diagnosis not present

## 2019-05-20 DIAGNOSIS — Z008 Encounter for other general examination: Secondary | ICD-10-CM | POA: Diagnosis not present

## 2019-05-20 DIAGNOSIS — Z780 Asymptomatic menopausal state: Secondary | ICD-10-CM | POA: Diagnosis not present

## 2019-05-20 DIAGNOSIS — R351 Nocturia: Secondary | ICD-10-CM | POA: Diagnosis not present

## 2019-05-20 DIAGNOSIS — M545 Low back pain: Secondary | ICD-10-CM | POA: Diagnosis not present

## 2019-05-20 DIAGNOSIS — Z79899 Other long term (current) drug therapy: Secondary | ICD-10-CM | POA: Diagnosis not present

## 2019-05-20 DIAGNOSIS — E1169 Type 2 diabetes mellitus with other specified complication: Secondary | ICD-10-CM | POA: Diagnosis not present

## 2019-08-20 IMAGING — CR DG CHEST 2V
2 series · 2 of 2 positions shown · non-contrast
Comparison: April 18, 2018

CLINICAL DATA: Headache and weakness. Dizziness. History scoliosis.

EXAM:
CHEST - 2 VIEW

[w chest pa]
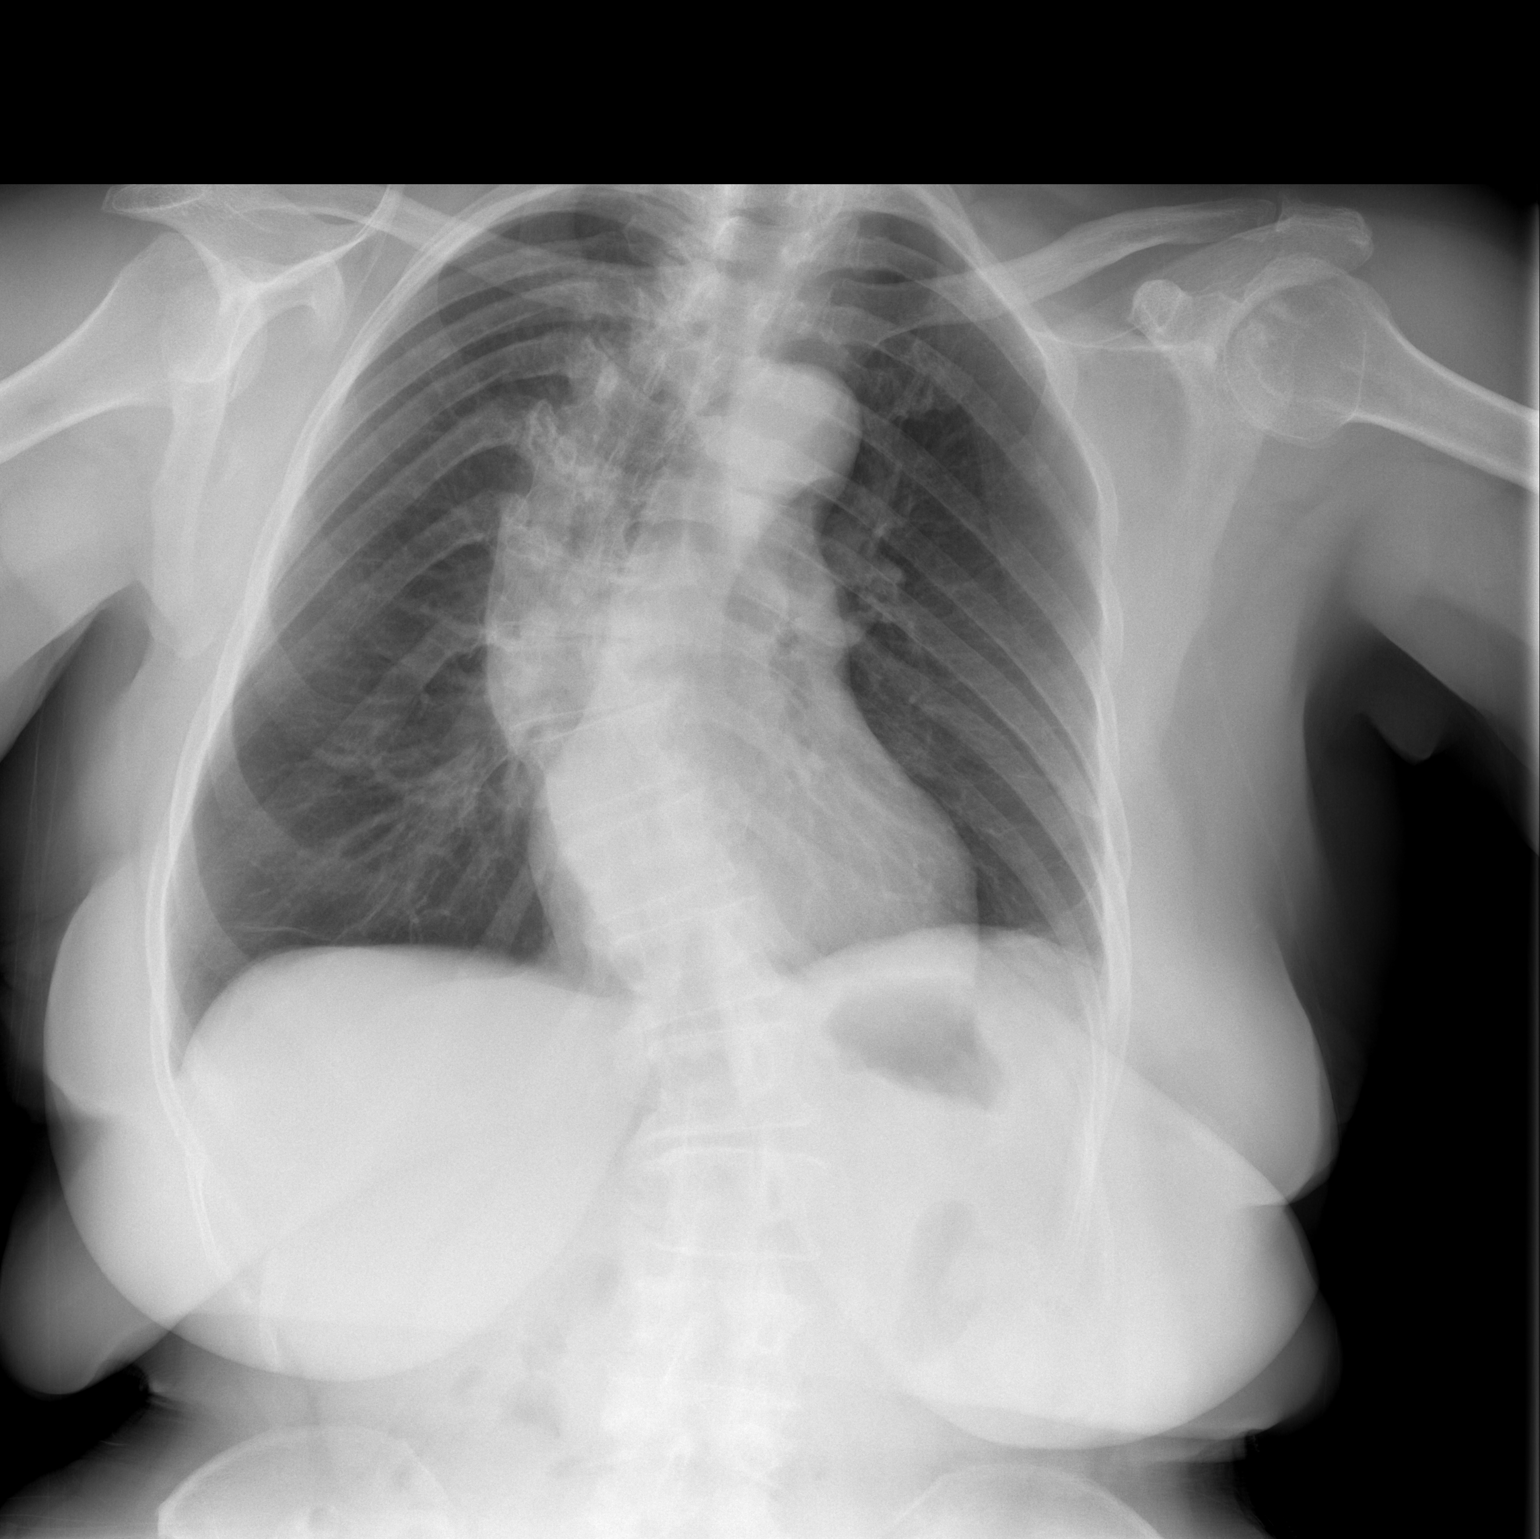

[w chest lat]
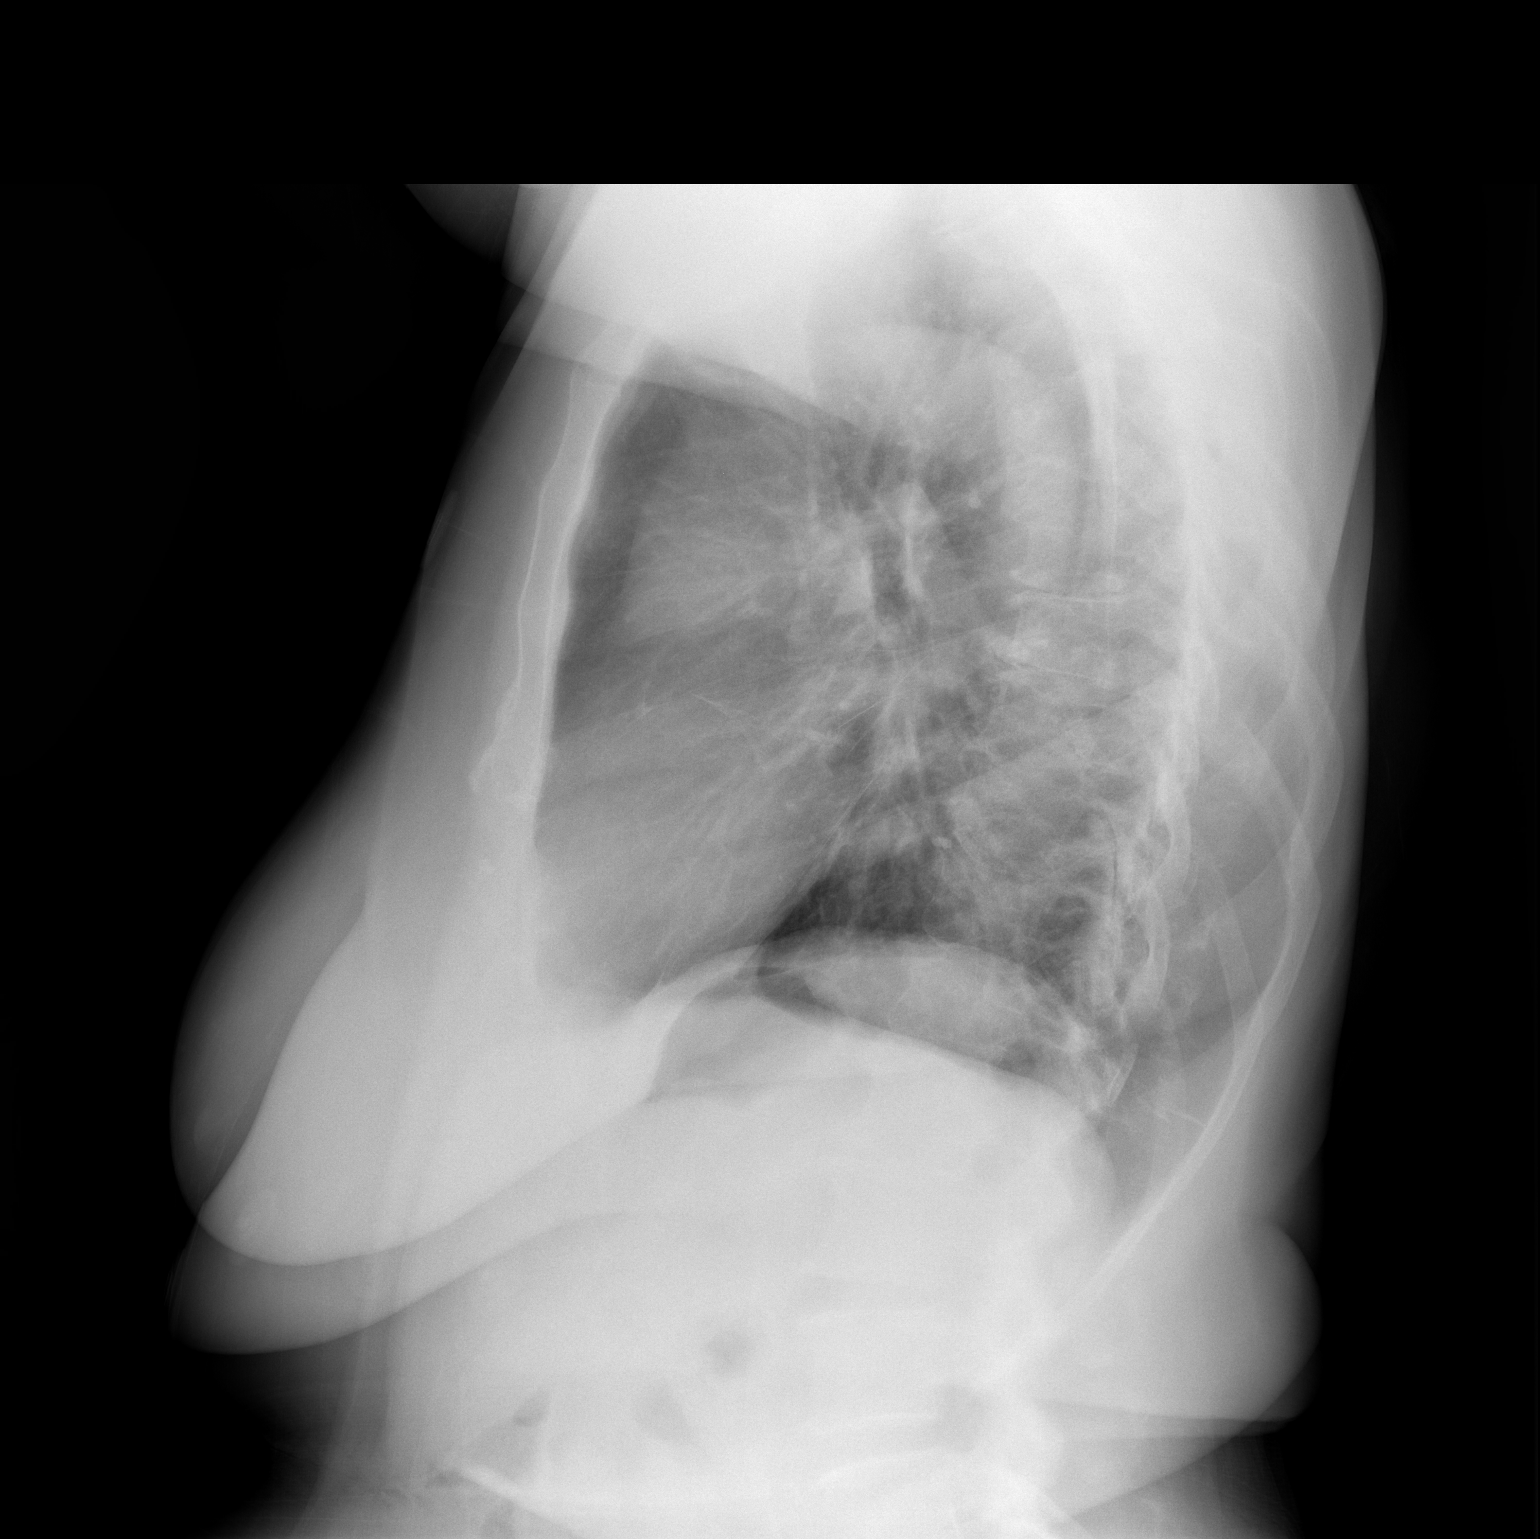

[2 of 2 positions shown; findings below may reference images not displayed]

FINDINGS: Severe scoliotic changes remain in the thoracic spine. The
cardiomediastinal silhouette is stable. No pneumothorax. No
pulmonary nodules, masses, or focal infiltrates.
IMPRESSION: No active cardiopulmonary disease.

## 2024-04-11 NOTE — ED Provider Notes (Signed)
 Patient placed in First Look pathway, seen and evaluated for chief complaint of cough, congestion, sneezing, cold for 2 weeks. Fatigue, nausea, headache.  Pertinent exam findings include non-toxic in appearance and speech is clear. Based on initial evaluation, labs are currently indicated and radiology studies are currently indicated as allowed for current processes and treatments as applicable in a triage setting and could be different than if patient were seen in a main treatment area or dependent on labs/imagining after results are displayed.  Patient counseled on process, plan, and necessity for staying for completing the evaluation.   This document serves as a record of services personally performed by Maranda Grate PA-C.   [facility] Emergency Department Provider Note  Name: Caroline Fernandez MRN: [id number] Chief Complaint:  Chief Complaint  Patient presents with  . Flu Symptoms    History and Review of Systems  Caroline Fernandez is a 83 y.o. year-old female with a medical history of htn, hld, DM presenting to the ED with chief complaint of approximately 2 weeks of rhinorrhea, sneezing, cough with intermittent sputum production over the last 2 weeks.  She reports some fatigue.  She reports normal appetite and p.o. intake.  Denies any chest pain, shortness of breath, hemoptysis, fevers, vomiting.   Review of Systems A pertinent review of systems was obtained and was negative except as noted in the HPI and MDM. Surgical History[1] Medical History[2]  Medical Decision Making  Past medical/surgical history that increases complexity of ED encounter: N/A  Authored By: Marce Demaris Riggs, DO   Assessment/Impression: Briefly, Caroline Fernandez is a 83 y.o. female with a past medical history as noted above who presents with cough, fatigue over the last 2 weeks.  She was initially prescribed azithromycin a few days ago.  Reasonable to evaluate for pneumonia in this  elderly patient.  However, likely to be bronchitis or rhinosinusitis.    Visit Vitals BP 137/67 (BP Location: Right arm, Patient Position: Sitting)  Pulse 59  Temp 97.9 F (36.6 C) (Oral)  Resp 17  SpO2 100%     Plan: Her labs was unremarkable.  Her chest x-ray showed left lower lobe atelectasis versus pneumonia.  Given prolonged symptoms, cough, elderly status elected to treat as pneumonia.  Treated with Levaquin.  Discussed outpatient follow-up in the next 1 week, strict return precautions discussed, appropriate for discharge.  Patient reassessment and disposition details:     Interpretation of Diagnostics: I personally reviewed the patient's ekg/lab tests/imaging and my interpretation is as follows: As above  Patient management required discussion with the following services or consulting groups: None   Patient's presentation is most consistent with acute complicated illness / injury requiring diagnostic workup.  Further history obtained from: Family Member  as stated in MDM/HPI.  Factors Impacting ED Encounter Risk: Discussion regarding prescription drug management   Physical Exam   Gen: A&Ox4, NAD  HEENT: NCAT, EOMI, not icteric. MMM.  Neck: Supple, full range of motion, no observable masses, No meningeal sign.  Lungs: No Respiratory distress. Lungs CTAB  CV: RRR, no audible MRG. No peripheral edema.  Abdomen: Soft, nontender, nondistended.  Skin: No rashes, petechiae, lesions. Normal color per patient.  Neuro: Oriented at baseline. Moves all 4 extremities equally    Procedures   Procedures  ED MEDS: Medications  levoFLOXacin (LEVAQUIN) tablet 750 mg (750 mg oral Given 04/11/24 2040)     No data recorded  CHA2DS2-VASc Score: N/A  Glasgow Coma Scale Score: 15  Final Clinical Impressions(s) 1. Community acquired pneumonia of left lower lobe of lung     ED Disposition:  Discharge   ED Prescriptions     Medication Sig Dispense  Start Date End Date Auth. Provider   levoFLOXacin (LEVAQUIN) 750 mg tablet Take 1 tablet (750 mg total) by mouth daily for 5 days. 5 tablet 04/11/2024 04/16/2024 Shriman Demaris Riggs, DO       Shriman [place], DO 04/12/2024           [1] Past Surgical History: Procedure Laterality Date  . BREAST SURGERY     Procedure: BREAST SURGERY  . CATARACT EXTRACTION Left 06/16/2014   Procedure: YAG CAPSULOTOMY  . CATARACT EXTRACTION Left 02/24/2010   Procedure: CATARACT EXTRACTION  . CATARACT EXTRACTION W/  INTRAOCULAR LENS IMPLANT Right 06/22/2016   Procedure: CATARACT EXTRACTION W/  INTRAOCULAR LENS IMPLANT; Dr. Lynwood Plover [id number]  20.5  SN [id number]  . HYSTERECTOMY      Procedure: HYSTERECTOMY  [2] Past Medical History: Diagnosis Date  . Bilateral dry eyes   . Diabetes mellitus    (CMD)   . Diabetes mellitus type II, controlled    (CMD)   . Diabetic retinopathy    (CMD)   . Hyperlipidemia   . Hypertension   . Pterygium of eye, right   . PVD (posterior vitreous detachment)

## 2024-04-16 NOTE — Nursing Note (Signed)
 AVS discussed with patient and son, Venetia while on phone with patient permission. Both verbalize understanding of discharge. And pt will take one more dose of levaquin on Thursday

## 2024-04-25 NOTE — ED Triage Notes (Signed)
 Pt states nausea, 1 episode of vomiting today. Intermittent upper abdominal pain x 1 week, worsened today, pt reports feeling fatigued. Pt with d/c from this hospital 1 week ago after being admitted with PNA.

## 2024-06-26 NOTE — Progress Notes (Signed)
 "   Hematology/Oncology Evaluation  Patient Name:  Caroline Fernandez Date of Birth:  [birth year] Date of Encounter:  06/27/2024  Referring Provider:  No ref. provider found,    PCP:  SHERI DAVIS LIM, DO  Diagnosis/Chief complaint/Reason for visit 83 year old found to have an abnormality on recent CT scan of the chest abdomen and pelvis   Problem list: Problem List[1]  Past Oncologic Therapy Oncology History   No problem history exists.     Current Therapy: Undergoing workup for mediastinal mass  Interim Note: Caroline Fernandez returns today for follow up of workup for mediastinal mass.  Clinically patient is fatigued but otherwise doing well.  No new complaints.  She had a PET scan performed results are noted below.  Allergies: Ace inhibitors, Lisinopril , Aspirin , and Amoxicillin  Medications: Current Medications[2]   Past Medical History: Medical History[3]  Past Surgical History: Surgical History[4]   Personal and Social History: Social History[5]  Family History: Cancer-related family history is not on file. She indicated that the status of her mother is unknown. She indicated that the status of her father is unknown. She indicated that the status of her maternal grandmother is unknown. She indicated that the status of her maternal grandfather is unknown. She indicated that the status of her paternal grandmother is unknown. She indicated that the status of her paternal grandfather is unknown. She indicated that the status of her neg hx is unknown.   Results for orders placed or performed during the hospital encounter of 06/14/24  POC Glucose   Collection Time: 06/14/24  9:58 AM  Result Value Ref Range   Glucose, POC 133 (H) 70 - 99 mg/dL   PET/CT Skull Base to Mid Thigh Result Date: 06/14/2024 PET/CT SKULL BASE TO MID THIGH, 06/14/2024 12:58 PM INDICATION: lymphoma, Non-Hodgkin lymphoma, unspecified, unspecified site    (CMD) \ C85.90 Non-Hodgkin lymphoma,  unspecified, unspecified site    (CMD) ADDITIONAL HISTORY: None. COMPARISON: CT chest abdomen pelvis 04/25/2024 TECHNIQUE: 68 minutes after the intravenous injection of 12 mCi F-18 FDG, PET imaging was obtained from the skull base through the mid thighs. These images were then attenuation corrected using low-dose CT and co-registered for anatomic localization. Standardized uptake values (SUV) were calculated using a lean body mass algorithm. Blood glucose at the time of injection was 133 mg/dL. All CT scans at [facility] and [facility] are performed using radiation dose optimization techniques as appropriate to a performed exam, including but not limited to one or more of the following: automatic exposure control, adjustment of the mA and/or kV according to patient size, use of iterative reconstruction technique. In addition, our institution participates in a radiation dose monitoring program to optimize patient radiation exposure. SUVmax of blood pool: 2.2 SUVmax of liver: 2.7 LIMITATIONS: None. FINDINGS: Slightly asymmetric activity in the right oropharynx SUV max 4. Small focal activity posterior right thyroid  lobe SUV max 2.4, could be further evaluated with thyroid  ultrasound. No evidence of enlarged or hypermetabolic cervical lymphadenopathy. Redemonstrated anterior mediastinal mass with activity similar to adjacent blood pool measuring 3.5 x 2 cm SUV max 2.2. There are nonenlarged mildly avid bilateral hilar and AP window nodes; for example left hilar SUV max 3.1, AP window SUV max 2.4. No hypermetabolic lung nodules. Coronary arteries atherosclerotic calcification/stenting. Normal size spleen without abnormal activity. Small accessory spleen noted. Diffuse activity throughout the bowel, likely physiologic. Apparent focal activity in the rectum SUV max 6.5. No evidence of enlarged or hypermetabolic  lymphadenopathy in the abdomen or pelvis. No hypermetabolic bony lesions.    CONCLUSION: 1.  Redemonstrated anterior mediastinal mass with activity similar to adjacent blood pool. 2.  Nonenlarged mildly avid bilateral hilar and mediastinal lymph nodes, favored reactive. 3.  Slightly asymmetric activity in the right oropharynx, nonspecific. Correlate with direct visualization. 4.  Apparent focal activity in the rectum, may be physiologic. Attention on follow-up or correlate with colonoscopy.    Physical Examination: Vital Signs: BP 145/85   Pulse 60   Temp 98.2 F (36.8 C)   Resp 16   Wt 72.2 kg (159 lb 3.2 oz)   SpO2 98%   BMI 29.12 kg/m  General: Pleasant female in no acute distress, ECOG 1 Lungs: Clear bilaterally CVS: Regular rhythm.  Abdomen: Soft nontender no HSM Extremities: No edema Neuro: Alert oriented otherwise nonfocal   Assessment and plan:83 y.o. female with   1.  Patient with mediastinal mass: Unclear etiology certainly could be potentially a thymoma.  But a malignancy needs to be ruled out.  PET scan is reviewed with her.  This anterior mediastinal mass has similar activity to the blood pool.  There are some nonenlarged mildly avid bilateral hilar and mediastinal lymph nodes favoring reactive.  I have recommended patient be seen by pulmonary for further evaluation and possible biopsies if needed.  2.  Follow-up patient is recommended to follow-up with her primary care provider.  A total of 20 minutes was spent in patient care today.  Greater than 50% of my time was spent face-to-face and nonface-to-face with the patient, reviewing her medical records, completing a physical examination, going over the results of the radiology reports, laboratory results, treatment of anterior mediastinal mass, possible thymoma, completing treatment plan and answering the patient and family's questions. Patient is encouraged to call with any questions or concerns if they should arise prior to next scheduled office visit.   This record has been created using  Conservation officer, historic buildings.         [1] Patient Active Problem List Diagnosis   Posterior vitreous detachment of right eye   Type 2 diabetes mellitus with hyperglycemia, without long-term current use of insulin     (CMD)   Essential hypertension   Hyperlipemia   GERD (gastroesophageal reflux disease)   Vitamin D deficiency   Pseudophakia of both eyes   Chronic kidney disease, stage 3a (CMD)   Obesity (BMI 30.0-34.9)   Referred otalgia of left ear   Generalized muscle weakness   left lower lobe lobar pneumonia  [2] Current Outpatient Medications  Medication Sig Dispense Refill   acetaminophen  (TYLENOL ) 500 mg tablet Take 2 tablets (1,000 mg total) by mouth every 4 to 6 hours as needed for headaches (pain). No more than 6 tablets per day 100 tablet 0   amLODIPine  (NORVASC ) 10 mg tablet Take 10 mg by mouth daily.     atorvastatin  (LIPITOR) 40 mg tablet Take 40 mg by mouth daily.     cholecalciferol (VITAMIN D3) 1,000 unit (25 mcg) tablet Take 1 each (1,000 Units total) by mouth daily. 100 each 0   glucose blood (OneTouch Verio test strips) test strip Check BG 1 time/day.  Dx E11.65 25 strip 11   lancets 33 gauge misc Check BG 1 time/day.  Dx E11.65 100 each 1   metFORMIN  (GLUCOPHAGE ) 500 mg tablet Take 500 mg by mouth daily with breakfast.     pantoprazole  (PROTONIX ) 40 mg EC tablet Take 40 mg by mouth every morning before breakfast.  potassium chloride  (KLOR-CON ) 20 mEq packet Take 20 mEq by mouth daily.     No current facility-administered medications for this visit.  [3] Past Medical History: Diagnosis Date   Bilateral dry eyes    Diabetes mellitus (CMD)    Diabetes mellitus type II, controlled (CMD)    Diabetic retinopathy    (CMD)    Hyperlipidemia    Hypertension    Pterygium of eye, right    PVD (posterior vitreous detachment)   [4] Past Surgical History: Procedure Laterality Date   BREAST SURGERY     Procedure: BREAST  SURGERY   CATARACT EXTRACTION Left 06/16/2014   Procedure: YAG CAPSULOTOMY   CATARACT EXTRACTION Left 02/24/2010   Procedure: CATARACT EXTRACTION   CATARACT EXTRACTION W/  INTRAOCULAR LENS IMPLANT Right 06/22/2016   Procedure: CATARACT EXTRACTION W/  INTRAOCULAR LENS IMPLANT; Dr. Lynwood Plover [id number]  20.5  SN [id number]   HYSTERECTOMY      Procedure: HYSTERECTOMY  [5] Social History Socioeconomic History   Marital status: Widowed  Tobacco Use   Smoking status: Never    Passive exposure: Past   Smokeless tobacco: Former  Building Services Engineer status: Never Used  Substance and Sexual Activity   Alcohol use: Not Currently   Drug use: Never   Sexual activity: Not Currently   Social Drivers of Health   Food Insecurity: Low Risk  (04/16/2024)   Food vital sign    Within the past 12 months, you worried that your food would run out before you got money to buy more: Never true    Within the past 12 months, the food you bought just didn't last and you didn't have money to get more: Never true  Transportation Needs: No Transportation Needs (04/16/2024)   Transportation    In the past 12 months, has lack of reliable transportation kept you from medical appointments, meetings, work or from getting things needed for daily living? : No  Safety: Low Risk  (06/27/2024)   Safety    How often does anyone, including family and friends, physically hurt you?: Never    How often does anyone, including family and friends, insult or talk down to you?: Never    How often does anyone, including family and friends, threaten you with harm?: Never    How often does anyone, including family and friends, scream or curse at you?: Never  Living Situation: Low Risk  (04/16/2024)   Living Situation    What is your living situation today?: I have a steady place to live    Think about the place you live. Do you have problems with any of the following? Choose all that apply:: None/None on this  list  "

## 2024-07-14 NOTE — Progress Notes (Signed)
 [facility]  New Patient Consultation  Primary Care Physician: Caroline NICHOLAUS GARTNER, Caroline Fernandez Provider requesting consultation: Khan, Caroline K, MD  Dear Caroline Fernandez,   Thank you for the consultation request for the patient's problem of mediastinal mass.  Below is the documentation for today's visit.  Subjective:   History of Present Illness Caroline Fernandez is a 83 y.o. year-old patient with hx of HTN, HLD, T2DM initially presenting to [facility] with cough, persistent pneumonia despite oral abx treatment. She had follow-up imaging showing persistent left lower lobe opacity this prompted a CT showed minimal pulmonary parenchymal pathology but found a 3.4cm mediastinal mass. She was evaluated by Dr. Fernand in clinic 05/17/24 (note reviewed for this encounter).This was followed by PET CT demonstrating minimal avidity of mass but some mild mediastinal and hilar lympadenopathy. She was referred by Dr. Fernand to [facility] for further evaluation.   Currently, she experiences intermittent coughing and hoarseness, which occasionally impedes his ability to speak. SHe also reports palpitations and unilateral back pain, raising concerns about potential kidney involvement. Additionally, she experiences hip discomfort during ambulation, described as a catching sensation that is transient and infrequent. She reports generalized malaise and occasional abdominal pain, predominantly on the left side.  She has experienced unintentional weight loss, dropping from 180 pounds to 160 pounds since his pneumonia diagnosis 3 to 4 months ago. She does not report any breathing troubles but admits to occasional chest pain unrelated to exertion, lasting a few minutes before resolving spontaneously. She has not experienced any fevers or chills since his pneumonia episode.   SOCIAL HISTORY Occupations: Retired Tobacco: Smoked cigarettes in his 40s but quit many years  ago.  FAMILY HISTORY - Grandmother: Cancer, source of bleeding unknown - Mother: Suspected cancer - Uncle: Tuberculosis - Older sister: Severe heart condition, deceased - Mother: Heart problems - Aunt: Heart problems   Review of Systems A complete ROS was performed with pertinent positives/negatives noted in the HPI. The remainder of the ROS are negative.   Past History:   Active Ambulatory Problems    Diagnosis Date Noted   Posterior vitreous detachment of right eye 09/23/2016   Type 2 diabetes mellitus with hyperglycemia, without long-term current use of insulin     (CMD) 03/13/2017   Essential hypertension 03/13/2017   Hyperlipemia 03/13/2017   GERD (gastroesophageal reflux disease) 03/13/2017   Vitamin D deficiency 03/13/2017   Pseudophakia of both eyes 09/04/2018   Chronic kidney disease, stage 3a (CMD) 08/04/2022   Obesity (BMI 30.0-34.9) 08/04/2022   Referred otalgia of left ear 03/31/2023   Generalized muscle weakness 04/16/2024   left lower lobe lobar pneumonia 04/19/2024   Mediastinal mass 06/27/2024   Resolved Ambulatory Problems    Diagnosis Date Noted   AKI (acute kidney injury) 04/15/2024   Past Medical History:  Diagnosis Date   Bilateral dry eyes    Diabetes mellitus (CMD)    Diabetes mellitus type II, controlled (CMD)    Diabetic retinopathy    (CMD)    Hyperlipidemia    Hypertension    Pterygium of eye, right    PVD (posterior vitreous detachment)    Family History[1] Social History Social History[2] Social History   Social History Narrative   Not on file     Medications:   Current Rx ordered in [facility][3]  Medication and other allergies: Ace inhibitors, Lisinopril , Aspirin , and Amoxicillin    Physical Exam:   Vitals:   07/16/24 0917  BP: 130/85  BP Location: Left arm  Patient Position: Sitting  Pulse: 62  Resp: 16  Temp: 98.1 F (36.7 C)  TempSrc: Oral  SpO2: 100%  Weight: 72.1 kg (159 lb)  Height:  1.575 m (5' 2)   General appearance: alert, appears stated age, and cooperative Head: Normocephalic, without obvious abnormality, atraumatic Neck: no adenopathy, no JVD, supple, symmetrical, trachea midline, and thyroid  not enlarged, symmetric, no tenderness/mass/nodules Lungs:clear to auscultation bilaterally. Heart: regular rate and rhythm, S1, S2 normal, no murmur, click, rub or gallop Extremities: extremities normal, atraumatic, no cyanosis or edema Neurologic: Grossly normal  Diagnostic Review:  I personally reviewed the following:  CT C/A/P 04/25/24      My interpretation: 3.5cm mediastinal mass. Faint left upper lobe ground glass nodule  PET CT 06/14/24   My interpretation:  Mildly avid bilateral lymphadenopathy. No significant avidity in mediastinal mass  Radiology read  IMPRESSION: CONCLUSION: 1.  Redemonstrated anterior mediastinal mass with activity similar to adjacent blood pool. 2.  Nonenlarged mildly avid bilateral hilar and mediastinal lymph nodes, favored reactive. 3.  Slightly asymmetric activity in the right oropharynx, nonspecific. Correlate with direct visualization. 4.  Apparent focal activity in the rectum, may be physiologic. Attention on follow-up or correlate with colonoscopy.  Labs  05/17/24 reviewed, no thrombocytopenia, CMP normal  Assessment and Plan:  #Mediastinal Mass #Pet avid bilateral hilar and mediastinal lymphadenopathy  Etiology of mediastinal mass remains unclear, differential includes thyoma, germ cell tumor, less likely lymphoma given no avidity on PET CT. Given concern for malignancy lymph nodes need to be valuated for possible metastatic disease. We discussed bronchoscopy with EBUS in detail and she was amenable to proceed. Plan for EBUS with focus on pet avid areas at [facility].  Thank you for the opportunity to provide consultation for your patient. If I can be of further assistance please Caroline Fernandez not hesitate to contact my  office.  Caroline Berthold Garner Eans, MD  Office Phone: [phone] Office Fax: [phone] [facility] [address], [address] [place], [place] [address]       [1] Family History Problem Relation Name Age of Onset   Diabetes Mother     Hypertension Mother     Glaucoma Mother     Hypertension Father     Diabetes Sister     Diabetes Sister     Hypertension Maternal Grandmother     Hypertension Maternal Grandfather     Hypertension Paternal Grandmother     Hypertension Paternal Grandfather     Macular degeneration Neg Hx    [2] Social History Tobacco Use   Smoking status: Never    Passive exposure: Past   Smokeless tobacco: Former  Building Services Engineer status: Never Used  Substance Use Topics   Alcohol use: Not Currently   Drug use: Never  [3] Meds Ordered in [facility]  Medication Sig Dispense Refill   acetaminophen  (TYLENOL ) 500 mg tablet Take 2 tablets (1,000 mg total) by mouth every 4 to 6 hours as needed for headaches (pain). No more than 6 tablets per day 100 tablet 0   amLODIPine  (NORVASC ) 10 mg tablet Take 10 mg by mouth daily.     atorvastatin  (LIPITOR) 40 mg tablet Take 40 mg by mouth daily.     cholecalciferol (VITAMIN D3) 1,000 unit (25 mcg) tablet Take 1 each (1,000 Units total) by mouth daily. 100 each 0   glucose blood (OneTouch Verio test strips) test strip Check BG 1 time/day.  Dx E11.65 25 strip  11   lancets 33 gauge misc Check BG 1 time/day.  Dx E11.65 100 each 1   metFORMIN  (GLUCOPHAGE ) 500 mg tablet Take 500 mg by mouth daily with breakfast.     pantoprazole  (PROTONIX ) 40 mg EC tablet Take 40 mg by mouth every morning before breakfast.     potassium chloride  (KLOR-CON ) 20 mEq packet Take 20 mEq by mouth daily.     No current Epic-ordered facility-administered medications on file.

## 2024-07-16 NOTE — Progress Notes (Signed)
 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  Business card with this NN's contact information given to patient. Patient saw Dr. Boby today for mediastinal mass.  Pet was done showing minimal avidity of mass but some mild mediastinal and hilar lympadenopathy.  Dr. Ghiathi recommends that patient have an EBUS.  Patient is agreeable to this plan. Plan:  Patient is scheduled to have an EBUS done on 08/08/2024 at 0830 at [facility].  Patient instructed to arrive at the Patient Admitting Department by 0715/0730.  Patient added to the endoscopy schedule.        :  Patient to hold metformin  starting the morning of procedure.          :  Patient instructed not to have anything to eat or drink after MN, the night before procedure.        :  Patient instructed to have a driver with her on the day of procedure due to general anesthesia.          :  Printed instructions explained and given to patient.  Patient verbalized understanding of all instructions, location, date, and time.  Encouraged patient to call this NN at [phone] with any questions

## 2024-08-01 NOTE — Telephone Encounter (Signed)
 Pt walked in stating both eyes X several weeks having been itchy & FBS tried tears (doesn't know name of them) PRN didn't seem to help much  -Denies any actual eye pain, no eye redness, no photophobia, no eye redness & no changes in vision at this time  -Advise pt start PF tears (name brand only) 4 times daily, start gel drop daily, start lid scrubs (baby shampoo) daily & can start warm compresses through out the day for comfort (given written instructions)  -Stressed to pt if no better, things gets worse or new symptoms come up let us  know ASAP -Pt expressed understanding

## 2024-08-08 NOTE — H&P (Signed)
 Pulmonary Preprocedural History and Physical   Chief Complaint/Reason for Procedure:  Caroline Fernandez is a 84 y.o. female scheduled for a Bronchoscopy, Diagnostic using deep sedation with propofol or general anesthesia as per anesthesia provider for the indication of sampling of lymphadenopathy   A History and Physical has been performed and patient medication allergies have been reviewed. The patient's tolerance of previous anesthesia has been reviewed. The risks and benefits of the procedure and the sedation options and risks were discussed with the patient. All questions were answered and informed consent obtained.   she is not anticoagulated.   RISKS / BENEFITS / ALTERNATIVES DISCUSSION WITH PATIENT AND/OR CAREGIVER GENERAL BRONCHOSCOPY. Discussed risks, benefits and alternatives. The following risks were detailed: While bronchoscopy is generally a safe procedure, it does carry some risks. Potential complications include bleeding (in approximately 1-2% of cases), infection (1-2%), pneumothorax, or collapsed lung (0.5-1%), and hypoxia (low oxygen levels) that is usually minor and rarely results in respiratory failure. Rarely, patients may experience bronchospasm, causing difficulty breathing (0.5-1%), or adverse reactions to anesthesia (1-2%). Other less common complications include arrhythmias (irregular heartbeats) in 0.1-0.5% of cases and minor throat irritation or bleeding at the biopsy site. Major bleeding is <0.1%. Patient and/or caregiver was given time and opportunity to ask any additional questions about risks, benefits, and alternatives and encourage to reach out to discuss any concerns that they have prior to the procedure.  and EBUS (endobronchial ultrasound) BRONCHOSCOPY. Discussed risks, benefits and alternatives. The following risks were detailed: While bronchoscopy is generally a safe procedure, it does carry some risks. Potential complications include bleeding (in approximately  1-2% of cases), infection (1-2%), pneumothorax, or collapsed lung (0.5-1%), and hypoxia (low oxygen levels) that is usually minor and rarely results in respiratory failure. Rarely, patients may experience bronchospasm, causing difficulty breathing (0.5-1%), or adverse reactions to anesthesia (1-2%). Other less common complications include arrhythmias (irregular heartbeats) in 0.1-0.5% of cases and minor throat irritation or bleeding at the biopsy site. Major bleeding is <0.1%. Patient and/or caregiver was given time and opportunity to ask any additional questions about risks, benefits, and alternatives and encourage to reach out to discuss any concerns that they have prior to the procedure.    Medical History[1]  Surgical History[2]  Family History[3]  Allergies[4]  Prior to Admission medications  Medication Sig Start Date End Date Taking? Authorizing Provider  amLODIPine  (NORVASC ) 10 mg tablet Take 10 mg by mouth daily. 05/12/16   HISTORICAL PROVIDER, CONVERSION  atorvastatin  (LIPITOR) 40 mg tablet Take 40 mg by mouth daily. 05/12/16   HISTORICAL PROVIDER, CONVERSION  cholecalciferol (VITAMIN D3) 1,000 unit (25 mcg) tablet Take 1 each (1,000 Units total) by mouth daily. Patient taking differently: Take 1,000 Units by mouth daily. 04/16/24 07/25/24  Honora Lecher, MD  glucose blood (OneTouch Verio test strips) test strip Check BG 1 time/day.  Dx E11.65 03/13/17   Autumn Reggy Molt, PA-C  lancets 33 gauge misc Check BG 1 time/day.  Dx E11.65 03/13/17   Autumn Reggy Molt, PA-C  metFORMIN  (GLUCOPHAGE ) 500 mg tablet Take 500 mg by mouth daily with breakfast. 05/12/16   HISTORICAL PROVIDER, CONVERSION  pantoprazole  (PROTONIX ) 40 mg EC tablet Take 40 mg by mouth every morning before breakfast.    HISTORICAL PROVIDER, CONVERSION  potassium chloride  (KLOR-CON ) 20 mEq packet Take 20 mEq by mouth daily. 05/12/16   HISTORICAL PROVIDER, CONVERSION     Pre-Procedure Physical:  BP 155/71   Pulse 75   Temp  97.7 F (36.5 C) (Skin)  SpO2 100%   GEN: AO, NAD HEENT: [place], MMM RESP: Breathing comfortably on RA. Mallampati 2 CV: RRR, no m/g/r ABD: soft, nontender, +BS EXT: no c/c/e LYMPH: no palpable LAD SKIN: warm, dry, no rash  ASA Class: ASA 3 - Patient with moderate systemic disease with functional limitations   Lab Results  Component Value Date   WBC 5.44 05/17/2024   HGB 12.2 (L) 05/17/2024   HCT 35.7 (L) 05/17/2024   PLT 235 05/17/2024   ALT 14 05/17/2024   AST 16 05/17/2024   NA 141 05/17/2024   K 3.8 05/17/2024   CL 102 05/17/2024   CREATININE 1.00 05/17/2024   BUN 11 05/17/2024   CO2 31 05/17/2024     Pertinent Imaging Reviewed:  Reviewed most recent CT chest and PET CT  Electronically signed by: Lonni Teena Garner Boby, MD 08/08/2024 8:26 AM        [1] Past Medical History: Diagnosis Date   Bilateral dry eyes    Diabetes mellitus (CMD)    Diabetes mellitus type II, controlled (CMD)    Diabetic retinopathy    (CMD)    Hyperlipidemia    Hypertension    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 Lynwood Plover [id number]  20.5  SN [id number]   HYSTERECTOMY      Procedure: HYSTERECTOMY  [3] Family History Problem Relation Name Age of Onset   Diabetes Mother     Hypertension Mother     Glaucoma Mother     Hypertension Father     Diabetes Sister     Diabetes Sister     Hypertension Maternal Grandmother     Hypertension Maternal Grandfather     Hypertension Paternal Grandmother     Hypertension  Paternal Grandfather     Macular degeneration Neg Hx    [4] Allergies Allergen Reactions   Ace Inhibitors Other (See Comments) and Swelling    Itching   Lisinopril  Swelling   Aspirin  Other (See Comments)    makes her nervous   Amoxicillin Itching and Rash

## 2024-08-09 NOTE — Telephone Encounter (Signed)
 Called patient with results, negative EBUS and EBBx normal airway mucosa.   [facility] from my perspective patient can come off TB schedule unless Dr. Fernand would like to discuss.
